# Patient Record
Sex: Male | Born: 1980 | Race: White | Hispanic: No | Marital: Married | Smoking: Never smoker
Health system: Southern US, Community
[De-identification: ages and names within clinical notes are randomized; demographics above are authoritative.]

## PROBLEM LIST (undated history)

## (undated) DIAGNOSIS — S92919A Unspecified fracture of unspecified toe(s), initial encounter for closed fracture: Secondary | ICD-10-CM

## (undated) DIAGNOSIS — M1990 Unspecified osteoarthritis, unspecified site: Secondary | ICD-10-CM

## (undated) DIAGNOSIS — Z8781 Personal history of (healed) traumatic fracture: Secondary | ICD-10-CM

## (undated) DIAGNOSIS — I82409 Acute embolism and thrombosis of unspecified deep veins of unspecified lower extremity: Secondary | ICD-10-CM

## (undated) HISTORY — PX: SPINE SURGERY: SHX786

## (undated) HISTORY — PX: ABDOMINAL SURGERY: SHX537

## (undated) HISTORY — PX: COSMETIC SURGERY: SHX468

---

## 2016-11-28 DIAGNOSIS — Z0000 Encounter for general adult medical examination without abnormal findings: Secondary | ICD-10-CM | POA: Diagnosis not present

## 2017-01-15 DIAGNOSIS — Z713 Dietary counseling and surveillance: Secondary | ICD-10-CM | POA: Diagnosis not present

## 2017-01-15 DIAGNOSIS — Z6832 Body mass index (BMI) 32.0-32.9, adult: Secondary | ICD-10-CM | POA: Diagnosis not present

## 2017-01-15 DIAGNOSIS — Z136 Encounter for screening for cardiovascular disorders: Secondary | ICD-10-CM | POA: Diagnosis not present

## 2017-04-09 DIAGNOSIS — K2900 Acute gastritis without bleeding: Secondary | ICD-10-CM | POA: Diagnosis not present

## 2018-01-26 DIAGNOSIS — M9903 Segmental and somatic dysfunction of lumbar region: Secondary | ICD-10-CM | POA: Diagnosis not present

## 2018-01-26 DIAGNOSIS — M5442 Lumbago with sciatica, left side: Secondary | ICD-10-CM | POA: Diagnosis not present

## 2018-01-27 DIAGNOSIS — M5442 Lumbago with sciatica, left side: Secondary | ICD-10-CM | POA: Diagnosis not present

## 2018-01-27 DIAGNOSIS — M9903 Segmental and somatic dysfunction of lumbar region: Secondary | ICD-10-CM | POA: Diagnosis not present

## 2018-01-29 DIAGNOSIS — M9903 Segmental and somatic dysfunction of lumbar region: Secondary | ICD-10-CM | POA: Diagnosis not present

## 2018-01-29 DIAGNOSIS — M5442 Lumbago with sciatica, left side: Secondary | ICD-10-CM | POA: Diagnosis not present

## 2018-02-02 DIAGNOSIS — M9903 Segmental and somatic dysfunction of lumbar region: Secondary | ICD-10-CM | POA: Diagnosis not present

## 2018-02-02 DIAGNOSIS — M5442 Lumbago with sciatica, left side: Secondary | ICD-10-CM | POA: Diagnosis not present

## 2018-02-03 DIAGNOSIS — M9903 Segmental and somatic dysfunction of lumbar region: Secondary | ICD-10-CM | POA: Diagnosis not present

## 2018-02-03 DIAGNOSIS — M5442 Lumbago with sciatica, left side: Secondary | ICD-10-CM | POA: Diagnosis not present

## 2018-02-04 DIAGNOSIS — M5442 Lumbago with sciatica, left side: Secondary | ICD-10-CM | POA: Diagnosis not present

## 2018-02-04 DIAGNOSIS — M9903 Segmental and somatic dysfunction of lumbar region: Secondary | ICD-10-CM | POA: Diagnosis not present

## 2018-02-10 DIAGNOSIS — M9903 Segmental and somatic dysfunction of lumbar region: Secondary | ICD-10-CM | POA: Diagnosis not present

## 2018-02-10 DIAGNOSIS — M5442 Lumbago with sciatica, left side: Secondary | ICD-10-CM | POA: Diagnosis not present

## 2018-02-11 DIAGNOSIS — M5442 Lumbago with sciatica, left side: Secondary | ICD-10-CM | POA: Diagnosis not present

## 2018-02-11 DIAGNOSIS — M9903 Segmental and somatic dysfunction of lumbar region: Secondary | ICD-10-CM | POA: Diagnosis not present

## 2018-02-12 DIAGNOSIS — M5442 Lumbago with sciatica, left side: Secondary | ICD-10-CM | POA: Diagnosis not present

## 2018-02-12 DIAGNOSIS — M9903 Segmental and somatic dysfunction of lumbar region: Secondary | ICD-10-CM | POA: Diagnosis not present

## 2018-02-18 DIAGNOSIS — M5442 Lumbago with sciatica, left side: Secondary | ICD-10-CM | POA: Diagnosis not present

## 2018-02-18 DIAGNOSIS — M9903 Segmental and somatic dysfunction of lumbar region: Secondary | ICD-10-CM | POA: Diagnosis not present

## 2018-02-19 DIAGNOSIS — M5442 Lumbago with sciatica, left side: Secondary | ICD-10-CM | POA: Diagnosis not present

## 2018-02-19 DIAGNOSIS — M9903 Segmental and somatic dysfunction of lumbar region: Secondary | ICD-10-CM | POA: Diagnosis not present

## 2018-02-25 DIAGNOSIS — M5442 Lumbago with sciatica, left side: Secondary | ICD-10-CM | POA: Diagnosis not present

## 2018-02-25 DIAGNOSIS — M9903 Segmental and somatic dysfunction of lumbar region: Secondary | ICD-10-CM | POA: Diagnosis not present

## 2018-02-26 DIAGNOSIS — M5442 Lumbago with sciatica, left side: Secondary | ICD-10-CM | POA: Diagnosis not present

## 2018-02-26 DIAGNOSIS — M9903 Segmental and somatic dysfunction of lumbar region: Secondary | ICD-10-CM | POA: Diagnosis not present

## 2018-03-12 DIAGNOSIS — M545 Low back pain: Secondary | ICD-10-CM | POA: Diagnosis not present

## 2018-03-12 DIAGNOSIS — G8929 Other chronic pain: Secondary | ICD-10-CM | POA: Diagnosis not present

## 2018-03-18 DIAGNOSIS — M5127 Other intervertebral disc displacement, lumbosacral region: Secondary | ICD-10-CM | POA: Diagnosis not present

## 2018-03-18 DIAGNOSIS — M545 Low back pain: Secondary | ICD-10-CM | POA: Diagnosis not present

## 2018-03-18 DIAGNOSIS — M48061 Spinal stenosis, lumbar region without neurogenic claudication: Secondary | ICD-10-CM | POA: Diagnosis not present

## 2018-03-24 DIAGNOSIS — R030 Elevated blood-pressure reading, without diagnosis of hypertension: Secondary | ICD-10-CM | POA: Diagnosis not present

## 2018-03-24 DIAGNOSIS — Z6834 Body mass index (BMI) 34.0-34.9, adult: Secondary | ICD-10-CM | POA: Diagnosis not present

## 2018-03-24 DIAGNOSIS — M545 Low back pain: Secondary | ICD-10-CM | POA: Diagnosis not present

## 2018-05-29 DIAGNOSIS — M5126 Other intervertebral disc displacement, lumbar region: Secondary | ICD-10-CM | POA: Diagnosis not present

## 2018-06-02 ENCOUNTER — Other Ambulatory Visit: Payer: Self-pay | Admitting: Neurosurgery

## 2018-06-04 DIAGNOSIS — M5416 Radiculopathy, lumbar region: Secondary | ICD-10-CM | POA: Diagnosis not present

## 2018-06-04 DIAGNOSIS — M48061 Spinal stenosis, lumbar region without neurogenic claudication: Secondary | ICD-10-CM | POA: Diagnosis not present

## 2018-06-05 ENCOUNTER — Other Ambulatory Visit: Payer: Self-pay | Admitting: Neurosurgery

## 2018-06-05 DIAGNOSIS — M5126 Other intervertebral disc displacement, lumbar region: Secondary | ICD-10-CM

## 2018-06-09 DIAGNOSIS — M5416 Radiculopathy, lumbar region: Secondary | ICD-10-CM | POA: Diagnosis not present

## 2018-06-09 DIAGNOSIS — M48061 Spinal stenosis, lumbar region without neurogenic claudication: Secondary | ICD-10-CM | POA: Diagnosis not present

## 2018-06-11 DIAGNOSIS — R030 Elevated blood-pressure reading, without diagnosis of hypertension: Secondary | ICD-10-CM | POA: Diagnosis not present

## 2018-06-11 DIAGNOSIS — M48061 Spinal stenosis, lumbar region without neurogenic claudication: Secondary | ICD-10-CM | POA: Diagnosis not present

## 2018-06-11 DIAGNOSIS — M5126 Other intervertebral disc displacement, lumbar region: Secondary | ICD-10-CM | POA: Diagnosis not present

## 2018-06-11 DIAGNOSIS — M48062 Spinal stenosis, lumbar region with neurogenic claudication: Secondary | ICD-10-CM | POA: Diagnosis not present

## 2018-06-11 DIAGNOSIS — M5416 Radiculopathy, lumbar region: Secondary | ICD-10-CM | POA: Diagnosis not present

## 2018-06-11 DIAGNOSIS — M5440 Lumbago with sciatica, unspecified side: Secondary | ICD-10-CM | POA: Diagnosis not present

## 2018-06-12 ENCOUNTER — Other Ambulatory Visit: Payer: Self-pay | Admitting: Neurosurgery

## 2018-06-16 DIAGNOSIS — M5416 Radiculopathy, lumbar region: Secondary | ICD-10-CM | POA: Diagnosis not present

## 2018-06-16 DIAGNOSIS — M48061 Spinal stenosis, lumbar region without neurogenic claudication: Secondary | ICD-10-CM | POA: Diagnosis not present

## 2018-06-18 ENCOUNTER — Ambulatory Visit
Admission: RE | Admit: 2018-06-18 | Discharge: 2018-06-18 | Disposition: A | Discharge status: Home / Self Care | Payer: BLUE CROSS/BLUE SHIELD | Source: Ambulatory Visit | Attending: Neurosurgery | Admitting: Neurosurgery

## 2018-06-18 DIAGNOSIS — M5126 Other intervertebral disc displacement, lumbar region: Secondary | ICD-10-CM

## 2018-06-18 DIAGNOSIS — M5416 Radiculopathy, lumbar region: Secondary | ICD-10-CM | POA: Diagnosis not present

## 2018-06-18 DIAGNOSIS — M48061 Spinal stenosis, lumbar region without neurogenic claudication: Secondary | ICD-10-CM | POA: Diagnosis not present

## 2018-06-18 MED ORDER — IOPAMIDOL (ISOVUE-M 200) INJECTION 41%
1.0000 mL | Freq: Once | INTRAMUSCULAR | Status: AC
  Administered 2018-06-18: 1 mL via EPIDURAL

## 2018-06-18 MED ORDER — METHYLPREDNISOLONE ACETATE 40 MG/ML INJ SUSP (RADIOLOG
120.0000 mg | Freq: Once | INTRAMUSCULAR | Status: AC
  Administered 2018-06-18: 120 mg via EPIDURAL

## 2018-06-18 NOTE — Discharge Instructions (Signed)

## 2018-06-25 DIAGNOSIS — M48061 Spinal stenosis, lumbar region without neurogenic claudication: Secondary | ICD-10-CM | POA: Diagnosis not present

## 2018-06-25 DIAGNOSIS — M5416 Radiculopathy, lumbar region: Secondary | ICD-10-CM | POA: Diagnosis not present

## 2018-06-25 NOTE — Pre-Procedure Instructions (Signed)
Rockland Kotarski Quarry  06/25/2018      [id number]y Kindle [address] [place] [address] Phone: [phone] Fax: [phone]    Your procedure is scheduled on Friday October 18.  Report to [facility] Admitting at 10:00 A.M.  Call this number if you have problems the morning of surgery:  [phone]   Remember:  Do not eat or drink after midnight.    Take these medicines the morning of surgery with A SIP OF WATER: NONE  7 days prior to surgery STOP taking any Aspirin(unless otherwise instructed by your surgeon), Aleve, Naproxen, Ibuprofen, Motrin, Advil, Goody's, BC's, all herbal medications, fish oil, and all vitamins     Do not wear jewelry, make-up or nail polish.  Do not wear lotions, powders, or perfumes, or deodorant.  Do not shave 48 hours prior to surgery.  Men may shave face and neck.  Do not bring valuables to the hospital.  [facility] is not responsible for any belongings or valuables.  Contacts, dentures or bridgework may not be worn into surgery.  Leave your suitcase in the car.  After surgery it may be brought to your room.  For patients admitted to the hospital, discharge time will be determined by your treatment team.  Patients discharged the day of surgery will not be allowed to drive home.   Special instructions:    [facility]- Preparing For Surgery  Before surgery, you can play an important role. Because skin is not sterile, your skin needs to be as free of germs as possible. You can reduce the number of germs on your skin by washing with CHG (chlorahexidine gluconate) Soap before surgery.  CHG is an antiseptic cleaner which kills germs and bonds with the skin to continue killing germs even after washing.    Oral Hygiene is also important to reduce your risk of infection.  Remember - BRUSH YOUR TEETH THE MORNING OF SURGERY WITH YOUR REGULAR TOOTHPASTE  Please do not use if you have an allergy to CHG  or antibacterial soaps. If your skin becomes reddened/irritated stop using the CHG.  Do not shave (including legs and underarms) for at least 48 hours prior to first CHG shower. It is OK to shave your face.  Please follow these instructions carefully.   1. Shower the NIGHT BEFORE SURGERY and the MORNING OF SURGERY with CHG.   2. If you chose to wash your hair, wash your hair first as usual with your normal shampoo.  3. After you shampoo, rinse your hair and body thoroughly to remove the shampoo.  4. Use CHG as you would any other liquid soap. You can apply CHG directly to the skin and wash gently with a scrungie or a clean washcloth.   5. Apply the CHG Soap to your body ONLY FROM THE NECK DOWN.  Do not use on open wounds or open sores. Avoid contact with your eyes, ears, mouth and genitals (private parts). Wash Face and genitals (private parts)  with your normal soap.  6. Wash thoroughly, paying special attention to the area where your surgery will be performed.  7. Thoroughly rinse your body with warm water from the neck down.  8. DO NOT shower/wash with your normal soap after using and rinsing off the CHG Soap.  9. Pat yourself dry with a CLEAN TOWEL.  10. Wear CLEAN PAJAMAS to bed the night before surgery, wear comfortable clothes the morning of surgery  11. Place CLEAN SHEETS on your bed the night of your first shower and DO NOT SLEEP WITH PETS.    Day of Surgery:  Do not apply any deodorants/lotions.  Please wear clean clothes to the hospital/surgery center.   Remember to brush your teeth WITH YOUR REGULAR TOOTHPASTE.    Please read over the following fact sheets that you were given. Coughing and Deep Breathing, MRSA Information and Surgical Site Infection Prevention

## 2018-06-26 ENCOUNTER — Encounter (HOSPITAL_COMMUNITY)
Admission: RE | Admit: 2018-06-26 | Discharge: 2018-06-26 | Disposition: A | Discharge status: Home / Self Care | Payer: BLUE CROSS/BLUE SHIELD | Source: Ambulatory Visit | Attending: Neurosurgery | Admitting: Neurosurgery

## 2018-06-26 ENCOUNTER — Encounter (HOSPITAL_COMMUNITY): Payer: Self-pay

## 2018-06-26 ENCOUNTER — Other Ambulatory Visit: Payer: Self-pay

## 2018-06-26 DIAGNOSIS — M48061 Spinal stenosis, lumbar region without neurogenic claudication: Secondary | ICD-10-CM | POA: Diagnosis not present

## 2018-06-26 DIAGNOSIS — Z01818 Encounter for other preprocedural examination: Secondary | ICD-10-CM | POA: Insufficient documentation

## 2018-06-26 DIAGNOSIS — M5416 Radiculopathy, lumbar region: Secondary | ICD-10-CM | POA: Diagnosis not present

## 2018-06-26 HISTORY — DX: Unspecified fracture of unspecified toe(s), initial encounter for closed fracture: S92.919A

## 2018-06-26 HISTORY — DX: Personal history of (healed) traumatic fracture: Z87.81

## 2018-06-26 HISTORY — DX: Unspecified osteoarthritis, unspecified site: M19.90

## 2018-06-26 LAB — TYPE AND SCREEN
ABO/RH(D): A NEG
Antibody Screen: NEGATIVE

## 2018-06-26 LAB — CBC
HCT: 45.8 % (ref 39.0–52.0)
Hemoglobin: 15.1 g/dL (ref 13.0–17.0)
MCH: 28.4 pg (ref 26.0–34.0)
MCHC: 33 g/dL (ref 30.0–36.0)
MCV: 86.3 fL (ref 80.0–100.0)
NRBC: 0 % (ref 0.0–0.2)
PLATELETS: 226 10*3/uL (ref 150–400)
RBC: 5.31 MIL/uL (ref 4.22–5.81)
RDW: 12.8 % (ref 11.5–15.5)
WBC: 7.1 10*3/uL (ref 4.0–10.5)

## 2018-06-26 LAB — SURGICAL PCR SCREEN
MRSA, PCR: NEGATIVE
Staphylococcus aureus: NEGATIVE

## 2018-06-26 LAB — ABO/RH: ABO/RH(D): A NEG

## 2018-06-26 NOTE — Progress Notes (Signed)
PCP - Dr. Darrow Bussing Cardiologist - denies, and denies any cardiac studies.  Chest x-ray - N/A EKG - N/A  Aspirin Instructions: N/A  Anesthesia review: No  Patient denies shortness of breath, fever, cough and chest pain at PAT appointment   Patient verbalized understanding of instructions that were given to them at the PAT appointment. Patient was also instructed that they will need to review over the PAT instructions again at home before surgery.

## 2018-07-02 NOTE — Anesthesia Preprocedure Evaluation (Addendum)
Anesthesia Evaluation  Patient identified by MRN, date of birth, ID band Patient awake    Reviewed: Allergy & Precautions, H&P , NPO status , Patient's Chart, lab work & pertinent test results  Airway Mallampati: II  TM Distance: >3 FB Neck ROM: Full    Dental no notable dental hx. (+) Teeth Intact, Dental Advisory Given   Pulmonary neg pulmonary ROS,    Pulmonary exam normal breath sounds clear to auscultation       Cardiovascular Exercise Tolerance: Good negative cardio ROS Normal cardiovascular exam Rhythm:Regular Rate:Normal     Neuro/Psych negative neurological ROS  negative psych ROS   GI/Hepatic negative GI ROS, Neg liver ROS,   Endo/Other  negative endocrine ROS  Renal/GU negative Renal ROS     Musculoskeletal   Abdominal   Peds negative pediatric ROS (+)  Hematology negative hematology ROS (+)   Anesthesia Other Findings   Reproductive/Obstetrics                            Anesthesia Physical Anesthesia Plan  ASA: II  Anesthesia Plan: General   Post-op Pain Management:    Induction: Intravenous  PONV Risk Score and Plan: 2 and Treatment may vary due to age or medical condition, Ondansetron and Dexamethasone  Airway Management Planned: Oral ETT  Additional Equipment: Arterial line  Intra-op Plan:   Post-operative Plan: Extubation in OR  Informed Consent: I have reviewed the patients History and Physical, chart, labs and discussed the procedure including the risks, benefits and alternatives for the proposed anesthesia with the patient or authorized representative who has indicated his/her understanding and acceptance.   Dental advisory given  Plan Discussed with: CRNA  Anesthesia Plan Comments:        Anesthesia Quick Evaluation

## 2018-07-03 ENCOUNTER — Inpatient Hospital Stay (HOSPITAL_COMMUNITY): Payer: BLUE CROSS/BLUE SHIELD | Admitting: Anesthesiology

## 2018-07-03 ENCOUNTER — Inpatient Hospital Stay (HOSPITAL_COMMUNITY)
Admission: RE | Admit: 2018-07-03 | Discharge: 2018-07-04 | DRG: 460 | Disposition: A | Discharge status: Home / Self Care | Payer: BLUE CROSS/BLUE SHIELD | Source: Ambulatory Visit | Attending: Neurosurgery | Admitting: Neurosurgery

## 2018-07-03 ENCOUNTER — Inpatient Hospital Stay (HOSPITAL_COMMUNITY)
Admission: RE | Disposition: A | Discharge status: Home / Self Care | Payer: Self-pay | Source: Ambulatory Visit | Attending: Neurosurgery

## 2018-07-03 ENCOUNTER — Encounter (HOSPITAL_COMMUNITY): Payer: Self-pay | Admitting: General Practice

## 2018-07-03 ENCOUNTER — Inpatient Hospital Stay (HOSPITAL_COMMUNITY): Payer: BLUE CROSS/BLUE SHIELD

## 2018-07-03 ENCOUNTER — Other Ambulatory Visit: Payer: Self-pay

## 2018-07-03 DIAGNOSIS — R402363 Coma scale, best motor response, obeys commands, at hospital admission: Secondary | ICD-10-CM | POA: Diagnosis present

## 2018-07-03 DIAGNOSIS — R402143 Coma scale, eyes open, spontaneous, at hospital admission: Secondary | ICD-10-CM | POA: Diagnosis not present

## 2018-07-03 DIAGNOSIS — M48061 Spinal stenosis, lumbar region without neurogenic claudication: Principal | ICD-10-CM | POA: Diagnosis present

## 2018-07-03 DIAGNOSIS — R402253 Coma scale, best verbal response, oriented, at hospital admission: Secondary | ICD-10-CM | POA: Diagnosis present

## 2018-07-03 DIAGNOSIS — M479 Spondylosis, unspecified: Secondary | ICD-10-CM | POA: Diagnosis not present

## 2018-07-03 DIAGNOSIS — M5126 Other intervertebral disc displacement, lumbar region: Secondary | ICD-10-CM | POA: Diagnosis not present

## 2018-07-03 DIAGNOSIS — Z419 Encounter for procedure for purposes other than remedying health state, unspecified: Secondary | ICD-10-CM

## 2018-07-03 DIAGNOSIS — M4690 Unspecified inflammatory spondylopathy, site unspecified: Secondary | ICD-10-CM | POA: Diagnosis not present

## 2018-07-03 DIAGNOSIS — Z981 Arthrodesis status: Secondary | ICD-10-CM | POA: Diagnosis not present

## 2018-07-03 DIAGNOSIS — M5116 Intervertebral disc disorders with radiculopathy, lumbar region: Secondary | ICD-10-CM | POA: Diagnosis not present

## 2018-07-03 DIAGNOSIS — M48062 Spinal stenosis, lumbar region with neurogenic claudication: Secondary | ICD-10-CM | POA: Diagnosis not present

## 2018-07-03 SURGERY — POSTERIOR LUMBAR FUSION 1 LEVEL
Anesthesia: General | Site: Back

## 2018-07-03 MED ORDER — METHOCARBAMOL 500 MG PO TABS
ORAL_TABLET | ORAL | Status: AC
  Administered 2018-07-03: 500 mg
  Filled 2018-07-03: qty 1

## 2018-07-03 MED ORDER — FENTANYL CITRATE (PF) 250 MCG/5ML IJ SOLN
INTRAMUSCULAR | Status: DC | PRN
  Administered 2018-07-03 (×2): 50 ug via INTRAVENOUS

## 2018-07-03 MED ORDER — 0.9 % SODIUM CHLORIDE (POUR BTL) OPTIME
TOPICAL | Status: DC | PRN
  Administered 2018-07-03 (×2): 1000 mL

## 2018-07-03 MED ORDER — ACETAMINOPHEN 650 MG RE SUPP: 650.0000 mg | RECTAL | Status: DC | PRN

## 2018-07-03 MED ORDER — PHENYLEPHRINE 40 MCG/ML (10ML) SYRINGE FOR IV PUSH (FOR BLOOD PRESSURE SUPPORT)
PREFILLED_SYRINGE | INTRAVENOUS | Status: AC
  Filled 2018-07-03: qty 30

## 2018-07-03 MED ORDER — ROCURONIUM BROMIDE 50 MG/5ML IV SOSY
PREFILLED_SYRINGE | INTRAVENOUS | Status: AC
  Filled 2018-07-03: qty 10

## 2018-07-03 MED ORDER — ROCURONIUM BROMIDE 10 MG/ML (PF) SYRINGE
PREFILLED_SYRINGE | INTRAVENOUS | Status: DC | PRN
  Administered 2018-07-03: 10 mg via INTRAVENOUS
  Administered 2018-07-03: 20 mg via INTRAVENOUS
  Administered 2018-07-03: 50 mg via INTRAVENOUS
  Administered 2018-07-03: 20 mg via INTRAVENOUS

## 2018-07-03 MED ORDER — CHLORHEXIDINE GLUCONATE CLOTH 2 % EX PADS: 6.0000 | MEDICATED_PAD | Freq: Once | CUTANEOUS | Status: DC

## 2018-07-03 MED ORDER — PROPOFOL 10 MG/ML IV BOLUS
INTRAVENOUS | Status: DC | PRN
  Administered 2018-07-03: 200 mg via INTRAVENOUS

## 2018-07-03 MED ORDER — OXYCODONE HCL 5 MG PO TABS
ORAL_TABLET | ORAL | Status: AC
  Administered 2018-07-03: 10 mg via ORAL
  Filled 2018-07-03: qty 2

## 2018-07-03 MED ORDER — LIDOCAINE 2% (20 MG/ML) 5 ML SYRINGE
INTRAMUSCULAR | Status: DC | PRN
  Administered 2018-07-03: 100 mg via INTRAVENOUS

## 2018-07-03 MED ORDER — GABAPENTIN 300 MG PO CAPS
300.0000 mg | ORAL_CAPSULE | Freq: Once | ORAL | Status: AC
  Administered 2018-07-03: 300 mg via ORAL

## 2018-07-03 MED ORDER — FENTANYL CITRATE (PF) 250 MCG/5ML IJ SOLN
INTRAMUSCULAR | Status: AC
  Filled 2018-07-03: qty 5

## 2018-07-03 MED ORDER — LIDOCAINE-EPINEPHRINE 1 %-1:100000 IJ SOLN
INTRAMUSCULAR | Status: AC
  Filled 2018-07-03: qty 1

## 2018-07-03 MED ORDER — VANCOMYCIN HCL 1000 MG IV SOLR
INTRAVENOUS | Status: AC
  Filled 2018-07-03: qty 1000

## 2018-07-03 MED ORDER — ONDANSETRON HCL 4 MG/2ML IJ SOLN
4.0000 mg | Freq: Four times a day (QID) | INTRAMUSCULAR | Status: DC | PRN
  Administered 2018-07-03: 4 mg via INTRAVENOUS
  Filled 2018-07-03: qty 2

## 2018-07-03 MED ORDER — HYDROMORPHONE HCL 1 MG/ML IJ SOLN
INTRAMUSCULAR | Status: DC | PRN
  Administered 2018-07-03: 0.5 mg via INTRAVENOUS
  Administered 2018-07-03 (×2): .25 mg via INTRAVENOUS

## 2018-07-03 MED ORDER — DEXAMETHASONE 4 MG PO TABS
4.0000 mg | ORAL_TABLET | Freq: Four times a day (QID) | ORAL | Status: DC
  Administered 2018-07-04: 4 mg via ORAL
  Filled 2018-07-03: qty 1

## 2018-07-03 MED ORDER — CEFAZOLIN SODIUM-DEXTROSE 2-4 GM/100ML-% IV SOLN
2.0000 g | INTRAVENOUS | Status: AC
  Administered 2018-07-03: 2 g via INTRAVENOUS

## 2018-07-03 MED ORDER — ACETAMINOPHEN 500 MG PO TABS
1000.0000 mg | ORAL_TABLET | Freq: Once | ORAL | Status: AC
  Administered 2018-07-03: 1000 mg via ORAL

## 2018-07-03 MED ORDER — HYDROMORPHONE HCL 1 MG/ML IJ SOLN
INTRAMUSCULAR | Status: AC
  Filled 2018-07-03: qty 0.5

## 2018-07-03 MED ORDER — HYDROCODONE-ACETAMINOPHEN 7.5-325 MG PO TABS: 1.0000 | ORAL_TABLET | Freq: Once | ORAL | Status: DC | PRN

## 2018-07-03 MED ORDER — OXYCODONE HCL 5 MG PO TABS
10.0000 mg | ORAL_TABLET | ORAL | Status: DC | PRN
  Administered 2018-07-03 – 2018-07-04 (×5): 10 mg via ORAL
  Filled 2018-07-03 (×4): qty 2

## 2018-07-03 MED ORDER — THROMBIN 20000 UNITS EX SOLR
CUTANEOUS | Status: DC | PRN
  Administered 2018-07-03: 12:00:00 via TOPICAL

## 2018-07-03 MED ORDER — DEXAMETHASONE SODIUM PHOSPHATE 4 MG/ML IJ SOLN
4.0000 mg | Freq: Four times a day (QID) | INTRAMUSCULAR | Status: DC
  Administered 2018-07-03: 4 mg via INTRAVENOUS
  Filled 2018-07-03: qty 1

## 2018-07-03 MED ORDER — SUGAMMADEX SODIUM 200 MG/2ML IV SOLN
INTRAVENOUS | Status: DC | PRN
  Administered 2018-07-03: 200 mg via INTRAVENOUS

## 2018-07-03 MED ORDER — PROMETHAZINE HCL 25 MG/ML IJ SOLN: 6.2500 mg | INTRAMUSCULAR | Status: DC | PRN

## 2018-07-03 MED ORDER — LIDOCAINE 2% (20 MG/ML) 5 ML SYRINGE
INTRAMUSCULAR | Status: AC
  Filled 2018-07-03: qty 5

## 2018-07-03 MED ORDER — ACETAMINOPHEN 10 MG/ML IV SOLN: 1000.0000 mg | Freq: Once | INTRAVENOUS | Status: DC | PRN

## 2018-07-03 MED ORDER — EPHEDRINE 5 MG/ML INJ
INTRAVENOUS | Status: AC
  Filled 2018-07-03: qty 20

## 2018-07-03 MED ORDER — PANTOPRAZOLE SODIUM 40 MG PO TBEC: 40.0000 mg | DELAYED_RELEASE_TABLET | Freq: Every day | ORAL | Status: DC

## 2018-07-03 MED ORDER — MIDAZOLAM HCL 5 MG/5ML IJ SOLN
INTRAMUSCULAR | Status: DC | PRN
  Administered 2018-07-03: 2 mg via INTRAVENOUS

## 2018-07-03 MED ORDER — BUPIVACAINE-EPINEPHRINE (PF) 0.25% -1:200000 IJ SOLN
INTRAMUSCULAR | Status: AC
  Filled 2018-07-03: qty 30

## 2018-07-03 MED ORDER — MIDAZOLAM HCL 2 MG/2ML IJ SOLN
INTRAMUSCULAR | Status: AC
  Filled 2018-07-03: qty 2

## 2018-07-03 MED ORDER — HYDROMORPHONE HCL 1 MG/ML IJ SOLN
0.2500 mg | INTRAMUSCULAR | Status: DC | PRN
  Administered 2018-07-03: 1 mg via INTRAVENOUS

## 2018-07-03 MED ORDER — CLONIDINE HCL 0.2 MG PO TABS
0.2000 mg | ORAL_TABLET | Freq: Once | ORAL | Status: AC
  Administered 2018-07-03: 0.2 mg via ORAL

## 2018-07-03 MED ORDER — GLYCOPYRROLATE PF 0.2 MG/ML IJ SOSY
PREFILLED_SYRINGE | INTRAMUSCULAR | Status: AC
  Filled 2018-07-03: qty 2

## 2018-07-03 MED ORDER — HYDROMORPHONE HCL 1 MG/ML IJ SOLN: 1.0000 mg | INTRAMUSCULAR | Status: DC | PRN

## 2018-07-03 MED ORDER — GABAPENTIN 300 MG PO CAPS
ORAL_CAPSULE | ORAL | Status: AC
  Administered 2018-07-03: 300 mg via ORAL
  Filled 2018-07-03: qty 1

## 2018-07-03 MED ORDER — ONDANSETRON HCL 4 MG PO TABS: 4.0000 mg | ORAL_TABLET | Freq: Four times a day (QID) | ORAL | Status: DC | PRN

## 2018-07-03 MED ORDER — SODIUM CHLORIDE 0.9% FLUSH: 3.0000 mL | Freq: Two times a day (BID) | INTRAVENOUS | Status: DC

## 2018-07-03 MED ORDER — HYDROXYZINE HCL 50 MG/ML IM SOLN
50.0000 mg | Freq: Four times a day (QID) | INTRAMUSCULAR | Status: DC | PRN
  Administered 2018-07-03: 50 mg via INTRAMUSCULAR
  Filled 2018-07-03: qty 1

## 2018-07-03 MED ORDER — ACETAMINOPHEN 325 MG PO TABS
650.0000 mg | ORAL_TABLET | ORAL | Status: DC | PRN
  Administered 2018-07-03: 650 mg via ORAL
  Filled 2018-07-03: qty 2

## 2018-07-03 MED ORDER — ALUM & MAG HYDROXIDE-SIMETH 200-200-20 MG/5ML PO SUSP: 30.0000 mL | Freq: Four times a day (QID) | ORAL | Status: DC | PRN

## 2018-07-03 MED ORDER — HYDROMORPHONE HCL 1 MG/ML IJ SOLN
INTRAMUSCULAR | Status: AC
  Filled 2018-07-03: qty 1

## 2018-07-03 MED ORDER — PROPOFOL 10 MG/ML IV BOLUS
INTRAVENOUS | Status: AC
  Filled 2018-07-03: qty 20

## 2018-07-03 MED ORDER — CEFAZOLIN SODIUM-DEXTROSE 2-4 GM/100ML-% IV SOLN
2.0000 g | Freq: Three times a day (TID) | INTRAVENOUS | Status: AC
  Administered 2018-07-03 – 2018-07-04 (×2): 2 g via INTRAVENOUS
  Filled 2018-07-03 (×2): qty 100

## 2018-07-03 MED ORDER — MENTHOL 3 MG MT LOZG: 1.0000 | LOZENGE | OROMUCOSAL | Status: DC | PRN

## 2018-07-03 MED ORDER — VANCOMYCIN HCL 1 G IV SOLR
INTRAVENOUS | Status: DC | PRN
  Administered 2018-07-03: 1000 mg via TOPICAL

## 2018-07-03 MED ORDER — ONDANSETRON HCL 4 MG/2ML IJ SOLN
INTRAMUSCULAR | Status: AC
  Filled 2018-07-03: qty 2

## 2018-07-03 MED ORDER — THROMBIN (RECOMBINANT) 20000 UNITS EX SOLR
CUTANEOUS | Status: AC
  Filled 2018-07-03: qty 20000

## 2018-07-03 MED ORDER — SUCCINYLCHOLINE CHLORIDE 200 MG/10ML IV SOSY
PREFILLED_SYRINGE | INTRAVENOUS | Status: AC
  Filled 2018-07-03: qty 10

## 2018-07-03 MED ORDER — CYCLOBENZAPRINE HCL 10 MG PO TABS
10.0000 mg | ORAL_TABLET | Freq: Three times a day (TID) | ORAL | Status: DC | PRN
  Administered 2018-07-03 – 2018-07-04 (×2): 10 mg via ORAL
  Filled 2018-07-03 (×2): qty 1

## 2018-07-03 MED ORDER — CEFAZOLIN SODIUM-DEXTROSE 2-4 GM/100ML-% IV SOLN
INTRAVENOUS | Status: AC
  Filled 2018-07-03: qty 100

## 2018-07-03 MED ORDER — LACTATED RINGERS IV SOLN
INTRAVENOUS | Status: DC
  Administered 2018-07-03 (×2): via INTRAVENOUS

## 2018-07-03 MED ORDER — SODIUM CHLORIDE 0.9 % IV SOLN
INTRAVENOUS | Status: DC | PRN
  Administered 2018-07-03: 12:00:00

## 2018-07-03 MED ORDER — CLONIDINE HCL 0.2 MG PO TABS
ORAL_TABLET | ORAL | Status: AC
  Administered 2018-07-03: 0.2 mg via ORAL
  Filled 2018-07-03: qty 1

## 2018-07-03 MED ORDER — BUPIVACAINE HCL (PF) 0.25 % IJ SOLN
INTRAMUSCULAR | Status: AC
  Filled 2018-07-03: qty 30

## 2018-07-03 MED ORDER — MEPERIDINE HCL 50 MG/ML IJ SOLN: 6.2500 mg | INTRAMUSCULAR | Status: DC | PRN

## 2018-07-03 MED ORDER — ACETAMINOPHEN 500 MG PO TABS
ORAL_TABLET | ORAL | Status: AC
  Administered 2018-07-03: 1000 mg via ORAL
  Filled 2018-07-03: qty 2

## 2018-07-03 MED ORDER — BUPIVACAINE LIPOSOME 1.3 % IJ SUSP
20.0000 mL | INTRAMUSCULAR | Status: AC
  Administered 2018-07-03: 20 mL
  Filled 2018-07-03: qty 20

## 2018-07-03 MED ORDER — DEXAMETHASONE SODIUM PHOSPHATE 10 MG/ML IJ SOLN
INTRAMUSCULAR | Status: DC | PRN
  Administered 2018-07-03: 10 mg via INTRAVENOUS

## 2018-07-03 MED ORDER — LIDOCAINE-EPINEPHRINE 1 %-1:100000 IJ SOLN
INTRAMUSCULAR | Status: DC | PRN
  Administered 2018-07-03: 10 mL

## 2018-07-03 MED ORDER — SODIUM CHLORIDE 0.9% FLUSH: 3.0000 mL | INTRAVENOUS | Status: DC | PRN

## 2018-07-03 MED ORDER — SODIUM CHLORIDE 0.9 % IV SOLN: 250.0000 mL | INTRAVENOUS | Status: DC

## 2018-07-03 MED ORDER — PHENOL 1.4 % MT LIQD: 1.0000 | OROMUCOSAL | Status: DC | PRN

## 2018-07-03 MED ORDER — ONDANSETRON HCL 4 MG/2ML IJ SOLN
INTRAMUSCULAR | Status: DC | PRN
  Administered 2018-07-03: 4 mg via INTRAVENOUS

## 2018-07-03 SURGICAL SUPPLY — 78 items
BAG DECANTER FOR FLEXI CONT (MISCELLANEOUS) ×2 IMPLANT
BASKET BONE COLLECTION (BASKET) ×2 IMPLANT
BENZOIN TINCTURE PRP APPL 2/3 (GAUZE/BANDAGES/DRESSINGS) ×2 IMPLANT
BIT DRILL 5.0/4.0 (BIT) ×1 IMPLANT
BLADE CLIPPER SURG (BLADE) ×2 IMPLANT
BLADE SURG 11 STRL SS (BLADE) ×2 IMPLANT
BONE VIVIGEN FORMABLE 5.4CC (Bone Implant) ×2 IMPLANT
BUR CUTTER 7.0 ROUND (BURR) ×2 IMPLANT
BUR MATCHSTICK NEURO 3.0 LAGG (BURR) ×2 IMPLANT
CAGE RISE 11-17-15 10X26 (Cage) ×4 IMPLANT
CANISTER SUCT 3000ML PPV (MISCELLANEOUS) ×2 IMPLANT
CAP LOCKING (Cap) ×4 IMPLANT
CAP LOCKING 5.5 CREO (Cap) ×4 IMPLANT
CARTRIDGE OIL MAESTRO DRILL (MISCELLANEOUS) ×1 IMPLANT
CONT SPEC 4OZ CLIKSEAL STRL BL (MISCELLANEOUS) ×6 IMPLANT
COVER BACK TABLE 60X90IN (DRAPES) ×2 IMPLANT
COVER WAND RF STERILE (DRAPES) ×2 IMPLANT
DECANTER SPIKE VIAL GLASS SM (MISCELLANEOUS) ×4 IMPLANT
DERMABOND ADVANCED (GAUZE/BANDAGES/DRESSINGS) ×1
DERMABOND ADVANCED .7 DNX12 (GAUZE/BANDAGES/DRESSINGS) ×1 IMPLANT
DIFFUSER DRILL AIR PNEUMATIC (MISCELLANEOUS) ×2 IMPLANT
DRAPE C-ARM 42X72 X-RAY (DRAPES) ×4 IMPLANT
DRAPE C-ARMOR (DRAPES) IMPLANT
DRAPE HALF SHEET 40X57 (DRAPES) IMPLANT
DRAPE LAPAROTOMY 100X72X124 (DRAPES) ×2 IMPLANT
DRAPE SURG 17X23 STRL (DRAPES) ×2 IMPLANT
DRILL 5.0/4.0 (BIT) ×2
DRSG OPSITE 4X5.5 SM (GAUZE/BANDAGES/DRESSINGS) IMPLANT
DRSG OPSITE POSTOP 4X6 (GAUZE/BANDAGES/DRESSINGS) ×4 IMPLANT
DURAPREP 26ML APPLICATOR (WOUND CARE) ×2 IMPLANT
ELECT BLADE 4.0 EZ CLEAN MEGAD (MISCELLANEOUS) ×2
ELECT REM PT RETURN 9FT ADLT (ELECTROSURGICAL) ×2
ELECTRODE BLDE 4.0 EZ CLN MEGD (MISCELLANEOUS) ×1 IMPLANT
ELECTRODE REM PT RTRN 9FT ADLT (ELECTROSURGICAL) ×1 IMPLANT
EVACUATOR 1/8 PVC DRAIN (DRAIN) ×2 IMPLANT
EVACUATOR 3/16  PVC DRAIN (DRAIN)
EVACUATOR 3/16 PVC DRAIN (DRAIN) IMPLANT
GAUZE 4X4 16PLY RFD (DISPOSABLE) IMPLANT
GAUZE SPONGE 4X4 12PLY STRL (GAUZE/BANDAGES/DRESSINGS) ×4 IMPLANT
GLOVE BIO SURGEON STRL SZ7 (GLOVE) IMPLANT
GLOVE BIO SURGEON STRL SZ8 (GLOVE) ×4 IMPLANT
GLOVE BIOGEL PI IND STRL 7.0 (GLOVE) IMPLANT
GLOVE BIOGEL PI INDICATOR 7.0 (GLOVE)
GLOVE ECLIPSE 7.5 STRL STRAW (GLOVE) ×2 IMPLANT
GLOVE EXAM NITRILE LRG STRL (GLOVE) IMPLANT
GLOVE EXAM NITRILE XL STR (GLOVE) IMPLANT
GLOVE EXAM NITRILE XS STR PU (GLOVE) IMPLANT
GLOVE INDICATOR 8.5 STRL (GLOVE) ×4 IMPLANT
GOWN STRL REUS W/ TWL LRG LVL3 (GOWN DISPOSABLE) ×1 IMPLANT
GOWN STRL REUS W/ TWL XL LVL3 (GOWN DISPOSABLE) ×3 IMPLANT
GOWN STRL REUS W/TWL 2XL LVL3 (GOWN DISPOSABLE) IMPLANT
GOWN STRL REUS W/TWL LRG LVL3 (GOWN DISPOSABLE) ×1
GOWN STRL REUS W/TWL XL LVL3 (GOWN DISPOSABLE) ×3
HEMOSTAT POWDER KIT SURGIFOAM (HEMOSTASIS) IMPLANT
KIT BASIN OR (CUSTOM PROCEDURE TRAY) ×2 IMPLANT
KIT TURNOVER KIT B (KITS) ×2 IMPLANT
MILL MEDIUM DISP (BLADE) ×2 IMPLANT
NEEDLE HYPO 21X1.5 SAFETY (NEEDLE) ×2 IMPLANT
NEEDLE HYPO 25X1 1.5 SAFETY (NEEDLE) ×2 IMPLANT
NS IRRIG 1000ML POUR BTL (IV SOLUTION) ×2 IMPLANT
OIL CARTRIDGE MAESTRO DRILL (MISCELLANEOUS) ×2
PACK LAMINECTOMY NEURO (CUSTOM PROCEDURE TRAY) ×2 IMPLANT
PAD ARMBOARD 7.5X6 YLW CONV (MISCELLANEOUS) ×6 IMPLANT
ROD 40MM SPINAL (Rod) ×4 IMPLANT
SHAFT CREO 30MM (Neuro Prosthesis/Implant) ×8 IMPLANT
SPONGE LAP 4X18 RFD (DISPOSABLE) IMPLANT
SPONGE SURGIFOAM ABS GEL 100 (HEMOSTASIS) ×2 IMPLANT
STRIP CLOSURE SKIN 1/2X4 (GAUZE/BANDAGES/DRESSINGS) ×2 IMPLANT
SUT VIC AB 0 CT1 18XCR BRD8 (SUTURE) ×1 IMPLANT
SUT VIC AB 0 CT1 8-18 (SUTURE) ×1
SUT VIC AB 2-0 CT1 18 (SUTURE) ×2 IMPLANT
SUT VIC AB 4-0 PS2 27 (SUTURE) ×2 IMPLANT
SYRINGE 20CC LL (MISCELLANEOUS) ×2 IMPLANT
TOWEL GREEN STERILE (TOWEL DISPOSABLE) ×2 IMPLANT
TOWEL GREEN STERILE FF (TOWEL DISPOSABLE) ×2 IMPLANT
TRAY FOLEY MTR SLVR 16FR STAT (SET/KITS/TRAYS/PACK) ×2 IMPLANT
TULIP CREP AMP 5.5MM (Orthopedic Implant) ×8 IMPLANT
WATER STERILE IRR 1000ML POUR (IV SOLUTION) ×2 IMPLANT

## 2018-07-03 NOTE — Anesthesia Procedure Notes (Signed)
Procedure Name: Intubation Date/Time: 07/03/2018 1:26 PM Performed by: Wilburn Cornelia, CRNA Pre-anesthesia Checklist: Patient identified, Emergency Drugs available, Suction available, Patient being monitored and Timeout performed Patient Re-evaluated:Patient Re-evaluated prior to induction Oxygen Delivery Method: Circle system utilized Preoxygenation: Pre-oxygenation with 100% oxygen Induction Type: IV induction Ventilation: Mask ventilation without difficulty Laryngoscope Size: Mac and 4 Grade View: Grade III Tube type: Oral Tube size: 7.5 mm Number of attempts: 1 Airway Equipment and Method: Stylet Placement Confirmation: ETT inserted through vocal cords under direct vision,  positive ETCO2,  CO2 detector and breath sounds checked- equal and bilateral Secured at: 23 cm Tube secured with: Tape Dental Injury: Teeth and Oropharynx as per pre-operative assessment

## 2018-07-03 NOTE — Op Note (Signed)
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  Posterior lumbar interbody fusion utilizing globus expandable cage system packed with locally harvested autograft mixed with vivigen  #3  Cortical screw fixation L3-4 utilizing the globus modular cortical screw set  Surgeon: Donalee Citrin  Assistant: Ervin Knack  Anesthesia: General  EBL: Minimal  HPI: 37 year old gentleman is a long-standing back bilateral leg pain rating down L3 and L4 nerve root pattern.  Work-up revealed large central herniated disc with severe start spinal stenosis at L3-4.  Due to patient's failed conservative treatment imaging findings and progression of clinical syndrome I recommended decompression stabilization procedure at L3-4.  I extensively reviewed the risks and benefits of the operation with him as well as perioperative course expectations of outcome and alternatives to surgery and he understood and agreed to proceed forward.  Operative procedure: Patient brought in the ER was induced under general anesthesia positioned prone Wilson frame his back was prepped and draped in routine sterile fashion.  Preoperative x-ray localized the appropriate level so after infiltration of 10 cc lidocaine with epi midline incision was made and Bovie elect cautery was used to take down the subcutaneous tissue and subperiosteal dissection was carried lamina of L3 and L4 bilaterally.  Intraoperative x-ray confirmed medication proper levels of bilateral laminotomies were performed initially with a high-speed drill The Bone Shavings and Mucus Trap.  Complete Facetectomies Were Performed Radical Foraminotomies the L3 and L4 Nerve Roots Bilateral.  Epidural Veins Were Coagulated There Was a  Very Large Central Disc Herniation Causing Severe Thecal Sac compression.  The disc space was incised on both sides this space was cleaned out and then we distractors in place endplate preparation was carried out removing a very large central disc fragments.  This decompressed the thecal sac and both L3 and L4 nerve roots.  I selected a 10-17 expandable cage packed with locally harvested autograft mixed and inserted on the patient's left side remove the distractor working on the right prepared the endplates packed local autograft centrally and inserted the contralateral cage.  After all the cage ministered and they were expanded appropriately fluoroscopy confirmed good position.  Attention taken a cortical screw placement under fluoroscopy pilot holes were drilled tapped probed and 6 oh/5 oh by 30 mm cortical screws were placed at L4 and L5 bilaterally then the postop AP and lateral fluoroscopy confirmed good position of the screws I packed some more bone laterally to the cages expect the foramina to confirm patency assembled the heads advanced the screw some more attached 40 mm rods and tightened everything in place.  I then speckle vancomycin powder in the wound injected Exparel in the fascia placed a medium Hemovac drain and closed wound in layers with interrupted Vicryl running 4 subcuticular in the skin Dermabond benzoin Steri-Strips and a sterile dressing was applied patient recovery room in stable condition.  At the end the case and account sponge counts were correct.

## 2018-07-03 NOTE — Transfer of Care (Signed)
Immediate Anesthesia Transfer of Care Note  Patient: Jeffrey Myers  Procedure(s) Performed: Lumbar Three-Four Posterior lumbar interbody fusion (N/A Back)  Patient Location: PACU  Anesthesia Type:General  Level of Consciousness: awake, alert  and oriented  Airway & Oxygen Therapy: Patient Spontanous Breathing and Patient connected to nasal cannula oxygen  Post-op Assessment: Report given to RN and Post -op Vital signs reviewed and stable  Post vital signs: Reviewed and stable  Last Vitals:  Vitals Value Taken Time  BP 126/89 07/03/2018  4:14 PM  Temp    Pulse 89 07/03/2018  4:18 PM  Resp 14 07/03/2018  4:16 PM  SpO2 96 % 07/03/2018  4:18 PM  Vitals shown include unvalidated device data.  Last Pain:  Vitals:   07/03/18 1026  TempSrc:   PainSc: 3       Patients Stated Pain Goal: 2 (07/03/18 1026)  Complications: No apparent anesthesia complications

## 2018-07-03 NOTE — H&P (Signed)
Jeffrey Myers is an 37 y.o. male.   Chief Complaint: Back and left greater than right leg pain HPI: 37 year old with long-standing back bilateral leg pain worse in the left refractory to all forms of conservative physical therapy anti-inflammatory steroid injections.  Work-up revealed severe spondylosis and disc herniation marked facet arthropathy and spinal stenosis at L3-4.  Due to patient progression of clinical syndrome imaging findings and failed conservative treatment I recommended decompressive laminectomy interbody fusion at L3-4.  I have extensively gone over the risks and benefits of the operation with him as well as perioperative course expectations of outcome and alternatives to surgery and he understands and agrees to proceed forward.  Past Medical History:  Diagnosis Date  . Arthritis   . Broken toe    At age 20  . History of broken finger     History reviewed. No pertinent surgical history.  History reviewed. No pertinent family history. Social History:  reports that he has never smoked. He has never used smokeless tobacco. His alcohol and drug histories are not on file.  Allergies: No Known Allergies  No medications prior to admission.    No results found for this or any previous visit (from the past 48 hour(s)). No results found.  Review of Systems  Musculoskeletal: Positive for back pain.  Neurological: Positive for tingling.    Blood pressure (!) 134/93, pulse 60, temperature 97.6 F (36.4 C), temperature source Oral, resp. rate 18, height 5\' 11"  (1.803 m), weight 108.9 kg, SpO2 98 %. Physical Exam  Constitutional: He is oriented to person, place, and time. He appears well-developed and well-nourished.  HENT:  Head: Normocephalic.  Eyes: Pupils are equal, round, and reactive to light.  Neck: Normal range of motion.  Respiratory: Effort normal.  GI: Soft.  Neurological: He is alert and oriented to person, place, and time. He has normal strength. GCS eye  subscore is 4. GCS verbal subscore is 5. GCS motor subscore is 6.  Strength 5 out of 5 iliopsoas, quads, hamstrings, gastroc, into tibialis, EHL.     Assessment/Plan 37 year old presents for an L3-4 posterior lumbar interbody fusion  Thekla Colborn P, MD 07/03/2018, 1:14 PM

## 2018-07-04 MED ORDER — CYCLOBENZAPRINE HCL 10 MG PO TABS: 10.0000 mg | ORAL_TABLET | Freq: Three times a day (TID) | ORAL | 0 refills | Status: DC | PRN

## 2018-07-04 MED ORDER — OXYCODONE HCL 10 MG PO TABS: 10.0000 mg | ORAL_TABLET | ORAL | 0 refills | Status: DC | PRN

## 2018-07-04 NOTE — Discharge Summary (Signed)
  Physician Discharge Summary  Patient ID: DRAYDON CLAIRMONT MRN: [id number] DOB/AGE: 39-Jan-1982 39 y.o. Estimated body mass index is 33.47 kg/m as calculated from the following:   Height as of this encounter: 5\' 11"  (1.803 m).   Weight as of this encounter: 108.9 kg.   Admit date: 07/03/2018 Discharge date: 07/04/2018  Admission Diagnoses: Herniated nucleus pulposus lumbar spinal stenosis L3-4  Discharge Diagnoses: Same Active Problems:   HNP (herniated nucleus pulposus), lumbar   Discharged Condition: good  Hospital Course: Patient was admitted to hospital underwent decompressive laminectomy and fusion L3-4 postoperatively patient did fairly well and covering the floor on the floor was ablating and voiding spontaneously tolerating regular diet and stable for discharge home.  Consults: Significant Diagnostic Studies: Treatments: L3-4 posterior lumbar interbody fusion Discharge Exam: Blood pressure 118/76, pulse 75, temperature 98.3 F (36.8 C), temperature source Oral, resp. rate 18, height 5\' 11"  (1.803 m), weight 108.9 kg, SpO2 94 %. Strength 5 out of 5 wound clean dry and intact  Disposition: Home   Allergies as of 07/04/2018   No Known Allergies     Medication List    TAKE these medications   cyclobenzaprine 10 MG tablet Commonly known as:  FLEXERIL Take 1 tablet (10 mg total) by mouth 3 (three) times daily as needed for muscle spasms.   Oxycodone HCl 10 MG Tabs Take 1 tablet (10 mg total) by mouth every 3 (three) hours as needed for severe pain ((score 7 to 10)).        Signed: Yannis Gumbs P 07/04/2018, 7:57 AM

## 2018-07-04 NOTE — Progress Notes (Signed)
Pt doing well. Pt and wife given D/C instructions with Rx's, verbal understanding was provided. Pt's incision is clean and dry with no sign of infection. Pt's IV and Hemovac were removed prior to D/C. Pt D/C'd home per MD order. Pt is stable @ D/C and has no other needs at this time. Rema Fendt, RN

## 2018-07-04 NOTE — Discharge Instructions (Signed)
Wound Care Keep incision covered and dry for one week.  If you shower prior to then, cover incision with plastic wrap.  You may remove outer bandage after one week and shower.  Do not put any creams, lotions, or ointments on incision. Leave steri-strips on neck.  They will fall off by themselves. Activity Walk each and every day, increasing distance each day. No lifting greater than 5 lbs.  Avoid bending, arching, or twisting. No driving for 2 weeks; may ride as a passenger locally. If provided with back brace, wear when out of bed.  It is not necessary to wear in bed. Diet Resume your normal diet.  Return to Work Will be discussed at you follow up appointment. Call Your Doctor If Any of These Occur Redness, drainage, or swelling at the wound.  Temperature greater than 101 degrees. Severe pain not relieved by pain medication. Incision starts to come apart. Follow Up Appt Call today for appointment in 1-2 weeks ([phone]) or for problems.  If you have any hardware placed in your spine, you will need an x-ray before your appointment.   Spinal Fusion, Care After These instructions give you information about caring for yourself after your procedure. Your doctor may also give you more specific instructions. Call your doctor if you have any problems or questions after your procedure. Follow these instructions at home: Medicines  Take over-the-counter and prescription medicines only as told by your doctor. These include any medicines for pain.  Do not drive for 24 hours if you received a sedative.  Do not drive or use heavy machinery while taking prescription pain medicine.  If you were prescribed an antibiotic medicine, take it as told by your doctor. Do not stop taking the antibiotic even if you start to feel better. Surgical Cut (Incision) Care  Follow instructions from your doctor about how to take care of your surgical cut. Make sure you: ? Wash your hands with soap and water  before you change your bandage (dressing). If you cannot use soap and water, use hand sanitizer. ? Change your bandage as told by your doctor. ? Leave stitches (sutures), skin glue, or skin tape (adhesive) strips in place. They may need to stay in place for 2 weeks or longer. If tape strips get loose and curl up, you may trim the loose edges. Do not remove tape strips completely unless your doctor says it is okay.  Keep your surgical cut clean and dry. Do not take baths, swim, or use a hot tub until your doctor says it is okay.  Check your surgical cut and the area around it every day for: ? Redness. ? Swelling. ? Fluid. Physical Activity  Return to your normal activities as told by your doctor. Ask your doctor what activities are safe for you. Rest and protect your back as much as you can.  Follow instructions from your doctor about how to move. Use good posture to help your spine heal.  Do not lift anything that is heavier than 8 lb (3.6 kg) or as told by your doctor until he or she says that it is safe. Do not lift anything over your head.  Do not twist or bend at the waist until your doctor says it is okay.  Avoid pushing or pulling motions.  Do not sit or lie down in the same position for long periods of time.  Do not start to exercise until your doctor says it is okay. Ask your doctor what kinds of  exercise you can do to make your back stronger. General instructions  If you were given a brace, use it as told by your doctor.  Wear compression stockings as told by your doctor.  Do not use tobacco products. These include cigarettes, chewing tobacco, or e-cigarettes. If you need help quitting, ask your doctor.  Keep all follow-up visits as told by your doctor. This is important. This includes any visits with your physical therapist, if this applies. Contact a doctor if:  Your pain gets worse.  Your medicine does not help your pain.  Your legs or feet become painful or  swollen.  Your surgical cut is red, swollen, or painful.  You have fluid, blood, or pus coming from your surgical cut.  You feel sick to your stomach (nauseous).  You throw up (vomit).  Your have weakness or loss of feeling (numbness) in your legs that is new or getting worse.  You have a fever.  You have trouble controlling when you pee (urinate) or poop (have a bowel movement). Get help right away if:  Your pain is very bad.  You have chest pain.  You have trouble breathing.  You start to have a cough. These symptoms may be an emergency. Do not wait to see if the symptoms will go away. Get medical help right away. Call your local emergency services (911 in the [place] Do not drive yourself to the hospital. This information is not intended to replace advice given to you by your health care provider. Make sure you discuss any questions you have with your health care provider. Document Released: 12/27/2010 Document Revised: 04/30/2016 Document Reviewed: 02/15/2015 Elsevier Interactive Patient Education  [employer].

## 2018-07-06 MED FILL — Thrombin (Recombinant) For Soln 20000 Unit: CUTANEOUS | Qty: 1 | Status: AC

## 2018-07-06 NOTE — Anesthesia Postprocedure Evaluation (Signed)
Anesthesia Post Note  Patient: Jeffrey Myers  Procedure(s) Performed: Lumbar Three-Four Posterior lumbar interbody fusion (N/A Back)     Patient location during evaluation: PACU Anesthesia Type: General Level of consciousness: awake and alert, awake and oriented Pain management: pain level controlled Vital Signs Assessment: post-procedure vital signs reviewed and stable Respiratory status: spontaneous breathing, nonlabored ventilation and respiratory function stable Cardiovascular status: blood pressure returned to baseline and stable Postop Assessment: no apparent nausea or vomiting Anesthetic complications: no    Last Vitals:  Vitals:   07/04/18 0404 07/04/18 0726  BP: 111/69 118/76  Pulse: 77 75  Resp: 20 18  Temp: 36.8 C 36.8 C  SpO2: 95% 94%    Last Pain:  Vitals:   07/04/18 0930  TempSrc:   PainSc: 5                  Cecile Hearing

## 2018-08-08 DIAGNOSIS — M545 Low back pain: Secondary | ICD-10-CM | POA: Diagnosis not present

## 2018-08-11 DIAGNOSIS — M5440 Lumbago with sciatica, unspecified side: Secondary | ICD-10-CM | POA: Diagnosis not present

## 2018-09-07 ENCOUNTER — Other Ambulatory Visit: Payer: Self-pay | Admitting: Neurosurgery

## 2018-09-07 DIAGNOSIS — M5440 Lumbago with sciatica, unspecified side: Secondary | ICD-10-CM

## 2018-09-14 ENCOUNTER — Ambulatory Visit
Admission: RE | Admit: 2018-09-14 | Discharge: 2018-09-14 | Disposition: A | Discharge status: Home / Self Care | Payer: BLUE CROSS/BLUE SHIELD | Source: Ambulatory Visit | Attending: Neurosurgery | Admitting: Neurosurgery

## 2018-09-14 DIAGNOSIS — M5440 Lumbago with sciatica, unspecified side: Secondary | ICD-10-CM

## 2018-09-14 DIAGNOSIS — M545 Low back pain: Secondary | ICD-10-CM | POA: Diagnosis not present

## 2018-09-25 DIAGNOSIS — H00016 Hordeolum externum left eye, unspecified eyelid: Secondary | ICD-10-CM | POA: Diagnosis not present

## 2018-10-27 DIAGNOSIS — M48062 Spinal stenosis, lumbar region with neurogenic claudication: Secondary | ICD-10-CM | POA: Diagnosis not present

## 2018-10-27 DIAGNOSIS — M5440 Lumbago with sciatica, unspecified side: Secondary | ICD-10-CM | POA: Diagnosis not present

## 2019-01-26 DIAGNOSIS — M5440 Lumbago with sciatica, unspecified side: Secondary | ICD-10-CM | POA: Diagnosis not present

## 2019-02-12 IMAGING — CT CT L SPINE W/O CM
1 of 6 series · 6 of 14 positions shown, 8 images · non-contrast
Comparison: Plain films lumbar spine from [HOSPITAL] [REDACTED] 08/11/2018. MRI lumbar spine 03/18/2018.

CLINICAL DATA: Patient status post L3-4 laminectomy and fusion
07/03/2018. The patient suffered a fall on stairs approximately 3
weeks after surgery with new onset right side low back pain.

EXAM:
CT LUMBAR SPINE WITHOUT CONTRAST
TECHNIQUE: Multidetector CT imaging of the lumbar spine was performed without
intravenous contrast administration. Multiplanar CT image
reconstructions were also generated.

[Series 3: l spine soft (person_name) · axial · 0.32mm/px · z∈[-364,-193]mm · 6 of 81 slices shown, 8 images]
[im 12/81  soft-tissue]
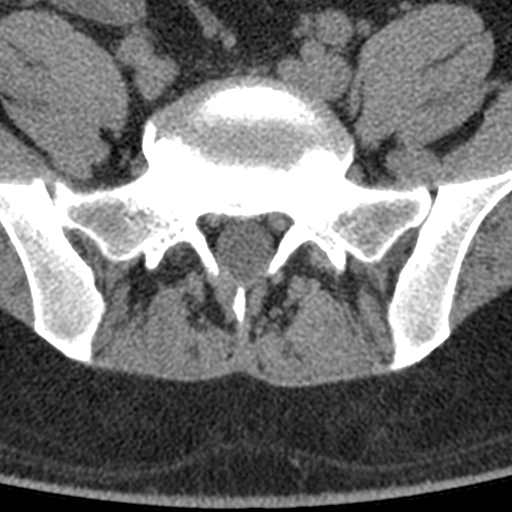
[im 12/81  bone]
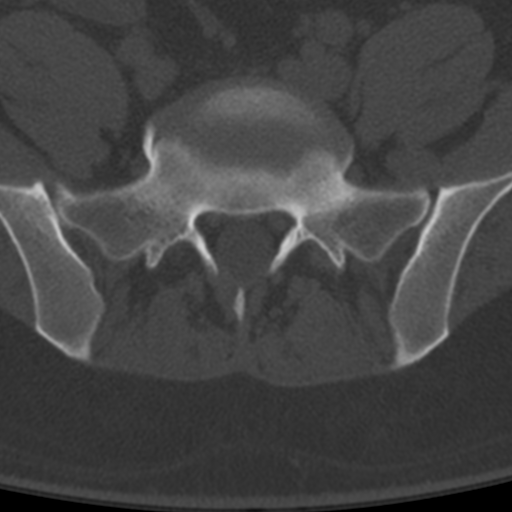
[im 23/81  bone]
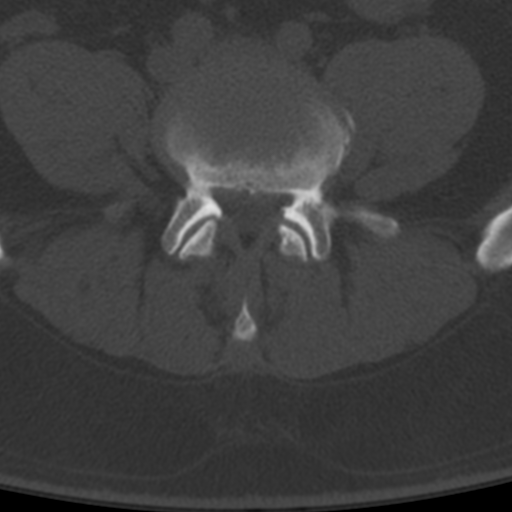
[im 35/81  bone]
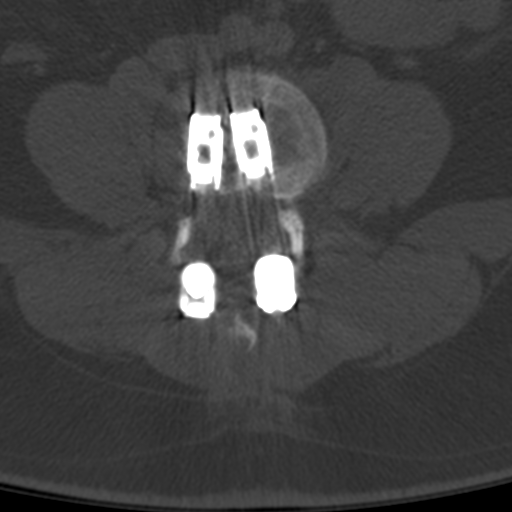
[im 46/81  bone]
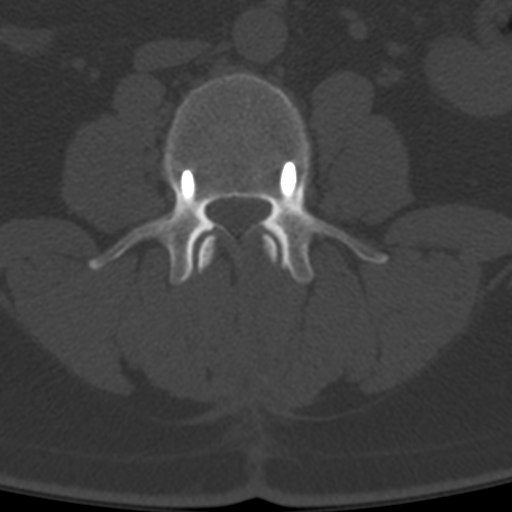
[im 58/81  soft-tissue]
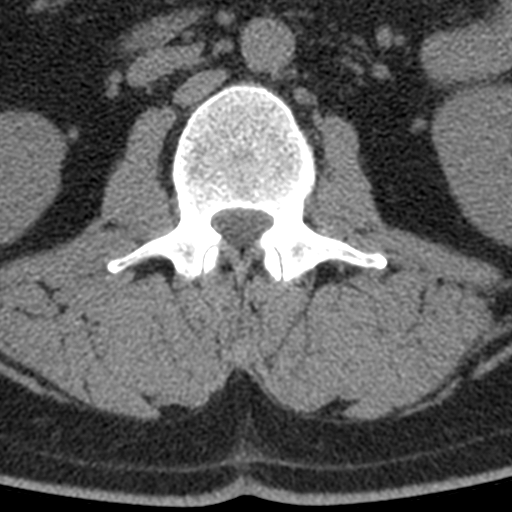
[im 58/81  bone]
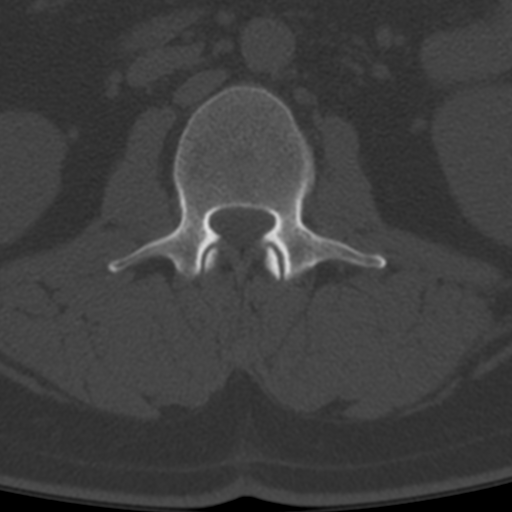
[im 69/81  bone]
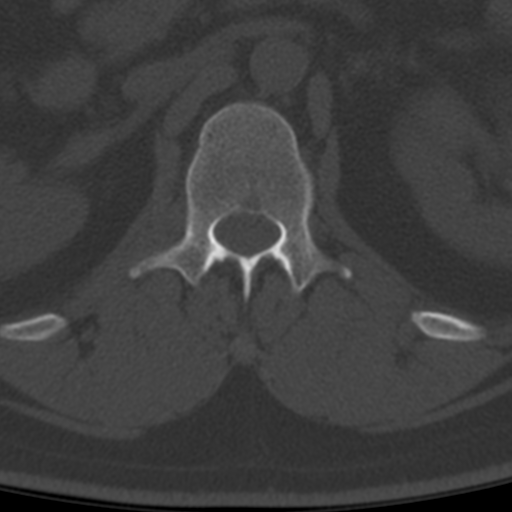

[6 of 14 positions shown; findings below may reference images not displayed]

FINDINGS: Segmentation: Standard.

Alignment: Normal.

Vertebrae: The patient is status post L3-4 laminectomy and fusion.
Pedicle screws are appropriately positioned and hardware is intact.
No evidence of loosening. Interbody spacer is also well positioned.
There is a tiny defect along the inferior margin of the right L3
pedicle has an appearance most consistent with a nutrient foramen
rather than a fracture. No fracture is identified.

Paraspinal and other soft tissues: Normal.

Disc levels: T12-L1 mild facet degenerative disease. Otherwise
negative.

L1-2: Mild facet degenerative change.  Otherwise negative.

L2-3: Mild facet degenerative change.  Otherwise negative.

L3-4: Status post laminectomy and fusion. The central canal and
foramina are widely patent. No subsidence of interbody spacer into
the endplates is identified. Areas of incorporation of bone graft
into the L3-4 endplates are seen.

L4-5: Shallow disc bulge causes mild central canal and lateral
recess narrowing. The foramina are open.

L5-S1: Shallow disc bulge without central canal or foraminal
stenosis.
IMPRESSION: Status post L3-4 laminectomy and fusion. Hardware is intact and
there is some incorporation of graft into the L3-4 endplates. Tiny
linear defect in the inferior margin of the right L3 pedicle has an
appearance most suggestive of a nutrient foramen rather than a
fracture. No acute bony abnormality is seen.

No marked change in a shallow disc bulge at L4-5 causing mild
central canal and bilateral lateral recess narrowing.

## 2019-07-19 DIAGNOSIS — R0981 Nasal congestion: Secondary | ICD-10-CM | POA: Diagnosis not present

## 2019-07-19 DIAGNOSIS — Z20828 Contact with and (suspected) exposure to other viral communicable diseases: Secondary | ICD-10-CM | POA: Diagnosis not present

## 2019-07-23 ENCOUNTER — Emergency Department (HOSPITAL_COMMUNITY): Payer: BC Managed Care – PPO

## 2019-07-23 ENCOUNTER — Encounter (HOSPITAL_COMMUNITY): Payer: Self-pay | Admitting: *Deleted

## 2019-07-23 ENCOUNTER — Emergency Department (HOSPITAL_COMMUNITY)
Admission: EM | Admit: 2019-07-23 | Discharge: 2019-07-23 | Disposition: A | Discharge status: Home / Self Care | Payer: BC Managed Care – PPO | Attending: Emergency Medicine | Admitting: Emergency Medicine

## 2019-07-23 DIAGNOSIS — Z79899 Other long term (current) drug therapy: Secondary | ICD-10-CM | POA: Insufficient documentation

## 2019-07-23 DIAGNOSIS — R55 Syncope and collapse: Secondary | ICD-10-CM

## 2019-07-23 DIAGNOSIS — R42 Dizziness and giddiness: Secondary | ICD-10-CM | POA: Diagnosis not present

## 2019-07-23 DIAGNOSIS — R110 Nausea: Secondary | ICD-10-CM | POA: Diagnosis not present

## 2019-07-23 DIAGNOSIS — U071 COVID-19: Secondary | ICD-10-CM | POA: Insufficient documentation

## 2019-07-23 LAB — COMPREHENSIVE METABOLIC PANEL
ALT: 34 U/L (ref 0–44)
AST: 29 U/L (ref 15–41)
Albumin: 3.6 g/dL (ref 3.5–5.0)
Alkaline Phosphatase: 37 U/L — ABNORMAL LOW (ref 38–126)
Anion gap: 10 (ref 5–15)
BUN: 11 mg/dL (ref 6–20)
CO2: 22 mmol/L (ref 22–32)
Calcium: 7.7 mg/dL — ABNORMAL LOW (ref 8.9–10.3)
Chloride: 110 mmol/L (ref 98–111)
Creatinine, Ser: 0.93 mg/dL (ref 0.61–1.24)
GFR calc Af Amer: >60 mL/min (ref >60)
GFR calc non Af Amer: >60 mL/min (ref >60)
Glucose, Bld: 118 mg/dL — ABNORMAL HIGH (ref 70–99)
Potassium: 3.6 mmol/L (ref 3.5–5.1)
Sodium: 142 mmol/L (ref 135–145)
Total Bilirubin: 0.7 mg/dL (ref 0.3–1.2)
Total Protein: 6.2 g/dL — ABNORMAL LOW (ref 6.5–8.1)

## 2019-07-23 LAB — CBC WITH DIFFERENTIAL/PLATELET
Abs Immature Granulocytes: 0.02 10*3/uL (ref 0.00–0.07)
Basophils Absolute: 0 10*3/uL (ref 0.0–0.1)
Basophils Relative: 0 %
Eosinophils Absolute: 0 10*3/uL (ref 0.0–0.5)
Eosinophils Relative: 0 %
HCT: 44.7 % (ref 39.0–52.0)
Hemoglobin: 14.3 g/dL (ref 13.0–17.0)
Immature Granulocytes: 1 %
Lymphocytes Relative: 16 %
Lymphs Abs: 0.6 10*3/uL — ABNORMAL LOW (ref 0.7–4.0)
MCH: 28.4 pg (ref 26.0–34.0)
MCHC: 32 g/dL (ref 30.0–36.0)
MCV: 88.7 fL (ref 80.0–100.0)
Monocytes Absolute: 0.5 10*3/uL (ref 0.1–1.0)
Monocytes Relative: 13 %
Neutro Abs: 2.8 10*3/uL (ref 1.7–7.7)
Neutrophils Relative %: 70 %
Platelets: 138 10*3/uL — ABNORMAL LOW (ref 150–400)
RBC: 5.04 MIL/uL (ref 4.22–5.81)
RDW: 13.8 % (ref 11.5–15.5)
WBC: 3.9 10*3/uL — ABNORMAL LOW (ref 4.0–10.5)
nRBC: 0 % (ref 0.0–0.2)

## 2019-07-23 LAB — LACTATE DEHYDROGENASE: LDH: 138 U/L (ref 98–192)

## 2019-07-23 LAB — PROCALCITONIN: Procalcitonin: <0.1 ng/mL

## 2019-07-23 LAB — FERRITIN: Ferritin: 97 ng/mL (ref 24–336)

## 2019-07-23 LAB — SARS CORONAVIRUS 2 (TAT 6-24 HRS): SARS Coronavirus 2: POSITIVE — AB

## 2019-07-23 LAB — LACTIC ACID, PLASMA: Lactic Acid, Venous: 1.3 mmol/L (ref 0.5–1.9)

## 2019-07-23 LAB — TRIGLYCERIDES: Triglycerides: 87 mg/dL (ref <150)

## 2019-07-23 LAB — D-DIMER, QUANTITATIVE: D-Dimer, Quant: 0.46 ug/mL-FEU (ref 0.00–0.50)

## 2019-07-23 LAB — C-REACTIVE PROTEIN: CRP: <0.8 mg/dL (ref <1.0)

## 2019-07-23 LAB — FIBRINOGEN: Fibrinogen: 425 mg/dL (ref 210–475)

## 2019-07-23 MED ORDER — ONDANSETRON HCL 4 MG/2ML IJ SOLN
4.0000 mg | Freq: Once | INTRAMUSCULAR | Status: AC
  Administered 2019-07-23: 4 mg via INTRAVENOUS
  Filled 2019-07-23: qty 2

## 2019-07-23 MED ORDER — SODIUM CHLORIDE 0.9 % IV SOLN
1000.0000 mL | INTRAVENOUS | Status: DC
  Administered 2019-07-23: 1000 mL via INTRAVENOUS

## 2019-07-23 MED ORDER — SODIUM CHLORIDE 0.9 % IV BOLUS
1000.0000 mL | Freq: Once | INTRAVENOUS | Status: AC
  Administered 2019-07-23: 1000 mL via INTRAVENOUS

## 2019-07-23 MED ORDER — ONDANSETRON HCL 4 MG PO TABS: 4.0000 mg | ORAL_TABLET | Freq: Three times a day (TID) | ORAL | 0 refills | Status: AC | PRN

## 2019-07-23 NOTE — ED Notes (Signed)
Unable to complete orthostatic vitals. Pt unable to stand long enough to obtain blood pressure.

## 2019-07-23 NOTE — Discharge Instructions (Signed)
Your labs are reassuring.  Take zofran as needed for nausea.  Stay hydrated and get plenty of rest.  Return if you develop persistent vomiting or if you have significant shortness of breath.

## 2019-07-23 NOTE — ED Triage Notes (Signed)
Per EMS, pt has nausea, vomiting, diarhea. Pt had near syncopal episode this morning, wife called 911. Pt tested positive for covid 4 days ago. Pt denies couch or shortness of breath. Pt feels like he will pass out if sitting up. Pt vomited once with EMS. Pt given 4 mg zofran, and fluids started.   BP 128/86 HR 62 CBG 143 Temp 96.8 SpO2 95%

## 2019-07-23 NOTE — ED Notes (Signed)
Unable to obtain 2nd set of blood cultures, 2 attempts made.

## 2019-07-23 NOTE — ED Provider Notes (Signed)
[facility] DEPT Provider Note   CSN: [id number] Arrival date & time: 07/23/19  0932     History   Chief Complaint Chief Complaint  Patient presents with  . Loss of Consciousness    COVID positive  . Nausea    HPI Jeffrey Myers is a 38 y.o. male.     The history is provided by the patient. No language interpreter was used.  Loss of Consciousness    38 year old male brought here via EMS for evaluation of near syncope.  Patient reports 6 days ago his wife was sick with cold symptoms.  The next day he also developed body aches, chills, some cough, chest pressure, feeling nauseous and vomiting.  He subsequently test positive for COVID-19 as well as his wife.  This morning, when he was using the bathroom, patient felt very nauseous subsequently sat on the commode and had a brief syncopal episode without falling onto the ground.  Wife was concerned, and contact EMS.  At this time patient states he feels a week and tired.  He also endorsed loss of taste but not loss of smell.  He denies any significant fever.  Denies hemoptysis.  He denies alcohol or tobacco use.  Past Medical History:  Diagnosis Date  . Arthritis   . Broken toe    At age 89  . History of broken finger     Patient Active Problem List   Diagnosis Date Noted  . HNP (herniated nucleus pulposus), lumbar 07/03/2018    History reviewed. No pertinent surgical history.      Home Medications    Prior to Admission medications   Medication Sig Start Date End Date Taking? Authorizing Provider  cyclobenzaprine (FLEXERIL) 10 MG tablet Take 1 tablet (10 mg total) by mouth 3 (three) times daily as needed for muscle spasms. 07/04/18   Kary Kos, MD  oxyCODONE 10 MG TABS Take 1 tablet (10 mg total) by mouth every 3 (three) hours as needed for severe pain ((score 7 to 10)). 07/04/18   Kary Kos, MD    Family History No family history on file.  Social History Social History    Tobacco Use  . Smoking status: Never Smoker  . Smokeless tobacco: Never Used  Substance Use Topics  . Alcohol use: Not on file  . Drug use: Not on file     Allergies   Patient has no known allergies.   Review of Systems Review of Systems  Cardiovascular: Positive for syncope.  All other systems reviewed and are negative.    Physical Exam Updated Vital Signs BP 120/80   Pulse 60   Temp 98.3 F (36.8 C) (Oral)   Resp 18   Ht 5\' 11"  (1.803 m)   Wt 106.1 kg   SpO2 98%   BMI 32.64 kg/m   Physical Exam Vitals signs and nursing note reviewed.  Constitutional:      General: He is not in acute distress.    Appearance: He is well-developed.  HENT:     Head: Atraumatic.  Eyes:     Conjunctiva/sclera: Conjunctivae normal.  Neck:     Musculoskeletal: Neck supple.  Cardiovascular:     Rate and Rhythm: Normal rate and regular rhythm.     Pulses: Normal pulses.     Heart sounds: Normal heart sounds.  Pulmonary:     Effort: Pulmonary effort is normal.     Breath sounds: Normal breath sounds.  Abdominal:     Palpations: Abdomen is  soft.     Tenderness: There is no abdominal tenderness.  Skin:    Findings: No rash.  Neurological:     Mental Status: He is alert and oriented to person, place, and time.  Psychiatric:        Mood and Affect: Mood normal.      ED Treatments / Results  Labs (all labs ordered are listed, but only abnormal results are displayed) Labs Reviewed  CBC WITH DIFFERENTIAL/PLATELET - Abnormal; Notable for the following components:      Result Value   WBC 3.9 (*)    Platelets 138 (*)    Lymphs Abs 0.6 (*)    All other components within normal limits  COMPREHENSIVE METABOLIC PANEL - Abnormal; Notable for the following components:   Glucose, Bld 118 (*)    Calcium 7.7 (*)    Total Protein 6.2 (*)    Alkaline Phosphatase 37 (*)    All other components within normal limits  SARS CORONAVIRUS 2 (TAT 6-24 HRS)  CULTURE, BLOOD (ROUTINE X 2)   CULTURE, BLOOD (ROUTINE X 2)  LACTIC ACID, PLASMA  D-DIMER, QUANTITATIVE (NOT AT [facility])  PROCALCITONIN  LACTATE DEHYDROGENASE  FERRITIN  TRIGLYCERIDES  FIBRINOGEN  C-REACTIVE PROTEIN  LACTIC ACID, PLASMA    EKG EKG Interpretation  Date/Time:  Friday July 23 2019 08:10:52 EST Ventricular Rate:  64 PR Interval:    QRS Duration: 91 QT Interval:  395 QTC Calculation: 408 R Axis:   55 Text Interpretation: Sinus rhythm RSR' in V1 or V2, right VCD or RVH No STEMI Confirmed by Alvester Chou [phone]) on 07/23/2019 10:24:26 AM   Radiology Dg Chest Port 1 View  Result Date: 07/23/2019 CLINICAL DATA:  Headache and body aches. Near syncope. COVID-19 positive EXAM: PORTABLE CHEST 1 VIEW COMPARISON:  None. FINDINGS: Lungs are clear. Heart is upper normal in size with pulmonary vascularity normal. No adenopathy. No bone lesions. IMPRESSION: No edema or consolidation. Heart upper normal in size. No evident adenopathy. Electronically Signed   By: Bretta Bang III M.D.   On: 07/23/2019 08:50    Procedures Procedures (including critical care time)  Medications Ordered in ED Medications  0.9 %  sodium chloride infusion (0 mLs Intravenous Stopped 07/23/19 1249)  sodium chloride 0.9 % bolus 1,000 mL (0 mLs Intravenous Stopped 07/23/19 1249)  ondansetron (ZOFRAN) injection 4 mg (4 mg Intravenous Given 07/23/19 0929)  ondansetron (ZOFRAN) injection 4 mg (4 mg Intravenous Given 07/23/19 1249)     Initial Impression / Assessment and Plan / ED Course  I have reviewed the triage vital signs and the nursing notes.  Pertinent labs & imaging results that were available during my care of the patient were reviewed by me and considered in my medical decision making (see chart for details).        BP 111/73   Pulse 77   Temp 98.3 F (36.8 C) (Oral)   Resp (!) 21   Ht 5\' 11"  (1.803 m)   Wt 106.1 kg   SpO2 97%   BMI 32.64 kg/m    Final Clinical Impressions(s) / ED Diagnoses   Final  diagnoses:  COVID-19  Near syncope    ED Discharge Orders         Ordered    ondansetron (ZOFRAN) 4 MG tablet  Every 8 hours PRN     07/23/19 1430         8:31 AM Patient recently tested positive for COVID-19 here with near syncope.  Patient overall well-appearing,  vital signs stable.  Work-up initiated.  IV fluid given.  No fever, no hypoxia.  2:32 PM Normal orthostatic vital sign, vital signs stable.  Patient endorsed nausea which improved with Zofran.  At this time he is stable for discharge.  Return precaution discussed.  Recommend self quarantine until symptoms resolved.    Jeffrey Myers was evaluated in Emergency Department on 07/23/2019 for the symptoms described in the history of present illness. He was evaluated in the context of the global COVID-19 pandemic, which necessitated consideration that the patient might be at risk for infection with the SARS-CoV-2 virus that causes COVID-19. Institutional protocols and algorithms that pertain to the evaluation of patients at risk for COVID-19 are in a state of rapid change based on information released by regulatory bodies including the CDC and federal and state organizations. These policies and algorithms were followed during the patient's care in the ED.    Fayrene Helperran, Shaylinn Hladik, PA-C 07/23/19 1434    Terald Sleeperrifan, Matthew J, MD 07/24/19 1149

## 2019-07-28 DIAGNOSIS — R0602 Shortness of breath: Secondary | ICD-10-CM | POA: Diagnosis not present

## 2019-07-28 DIAGNOSIS — R5383 Other fatigue: Secondary | ICD-10-CM | POA: Diagnosis not present

## 2019-07-28 DIAGNOSIS — U071 COVID-19: Secondary | ICD-10-CM | POA: Diagnosis not present

## 2019-07-28 LAB — CULTURE, BLOOD (ROUTINE X 2): Culture: NO GROWTH

## 2019-09-27 DIAGNOSIS — E669 Obesity, unspecified: Secondary | ICD-10-CM | POA: Diagnosis not present

## 2019-09-27 DIAGNOSIS — R635 Abnormal weight gain: Secondary | ICD-10-CM | POA: Diagnosis not present

## 2019-10-12 DIAGNOSIS — E669 Obesity, unspecified: Secondary | ICD-10-CM | POA: Diagnosis not present

## 2019-10-12 DIAGNOSIS — R635 Abnormal weight gain: Secondary | ICD-10-CM | POA: Diagnosis not present

## 2019-10-12 DIAGNOSIS — Z13220 Encounter for screening for lipoid disorders: Secondary | ICD-10-CM | POA: Diagnosis not present

## 2020-04-11 DIAGNOSIS — M545 Low back pain: Secondary | ICD-10-CM | POA: Diagnosis not present

## 2020-04-11 DIAGNOSIS — M5416 Radiculopathy, lumbar region: Secondary | ICD-10-CM | POA: Diagnosis not present

## 2020-04-19 DIAGNOSIS — Z01818 Encounter for other preprocedural examination: Secondary | ICD-10-CM | POA: Diagnosis not present

## 2020-04-27 DIAGNOSIS — M545 Low back pain: Secondary | ICD-10-CM | POA: Diagnosis not present

## 2020-05-10 DIAGNOSIS — L03316 Cellulitis of umbilicus: Secondary | ICD-10-CM | POA: Diagnosis not present

## 2020-05-11 ENCOUNTER — Encounter (HOSPITAL_BASED_OUTPATIENT_CLINIC_OR_DEPARTMENT_OTHER): Payer: Self-pay | Admitting: Emergency Medicine

## 2020-05-11 ENCOUNTER — Other Ambulatory Visit: Payer: Self-pay

## 2020-05-11 ENCOUNTER — Emergency Department (HOSPITAL_BASED_OUTPATIENT_CLINIC_OR_DEPARTMENT_OTHER)
Admission: EM | Admit: 2020-05-11 | Discharge: 2020-05-11 | Disposition: A | Discharge status: Home / Self Care | Payer: BC Managed Care – PPO | Attending: Emergency Medicine | Admitting: Emergency Medicine

## 2020-05-11 ENCOUNTER — Emergency Department (HOSPITAL_BASED_OUTPATIENT_CLINIC_OR_DEPARTMENT_OTHER)
Admission: EM | Admit: 2020-05-11 | Discharge: 2020-05-11 | Disposition: A | Discharge status: Home / Self Care | Payer: BC Managed Care – PPO | Source: Home / Self Care | Attending: Emergency Medicine | Admitting: Emergency Medicine

## 2020-05-11 ENCOUNTER — Emergency Department (HOSPITAL_BASED_OUTPATIENT_CLINIC_OR_DEPARTMENT_OTHER): Payer: BC Managed Care – PPO

## 2020-05-11 DIAGNOSIS — M79662 Pain in left lower leg: Secondary | ICD-10-CM | POA: Insufficient documentation

## 2020-05-11 DIAGNOSIS — I82402 Acute embolism and thrombosis of unspecified deep veins of left lower extremity: Secondary | ICD-10-CM | POA: Insufficient documentation

## 2020-05-11 DIAGNOSIS — M79605 Pain in left leg: Secondary | ICD-10-CM | POA: Diagnosis not present

## 2020-05-11 DIAGNOSIS — I824Y2 Acute embolism and thrombosis of unspecified deep veins of left proximal lower extremity: Secondary | ICD-10-CM

## 2020-05-11 DIAGNOSIS — I82442 Acute embolism and thrombosis of left tibial vein: Secondary | ICD-10-CM | POA: Diagnosis not present

## 2020-05-11 DIAGNOSIS — Z5321 Procedure and treatment not carried out due to patient leaving prior to being seen by health care provider: Secondary | ICD-10-CM | POA: Insufficient documentation

## 2020-05-11 DIAGNOSIS — Z7901 Long term (current) use of anticoagulants: Secondary | ICD-10-CM | POA: Insufficient documentation

## 2020-05-11 MED ORDER — APIXABAN 2.5 MG PO TABS: 10.0000 mg | ORAL_TABLET | Freq: Once | ORAL | Status: DC

## 2020-05-11 MED ORDER — APIXABAN (ELIQUIS) VTE STARTER PACK (10MG AND 5MG): ORAL_TABLET | ORAL | 0 refills | Status: AC

## 2020-05-11 MED ORDER — APIXABAN (ELIQUIS) EDUCATION KIT FOR DVT/PE PATIENTS: PACK | Freq: Once | Status: DC

## 2020-05-11 MED FILL — ELIQUIS STARTER PACK 5 MG T: 5 | 30 days supply | Qty: 74 | Fill #0

## 2020-05-11 NOTE — ED Triage Notes (Signed)
[place] Woke up today with L calf pain. Concerned for DVT

## 2020-05-11 NOTE — ED Provider Notes (Signed)
[facility] EMERGENCY DEPARTMENT Provider Note   CSN: [id number] Arrival date & time: 05/11/20  7106     History Chief Complaint  Patient presents with  . Leg Pain    Jeffrey Myers is a 39 y.o. male.  Patient with recent gynecomastia reduction surgery [place].  Now with left leg pain.  Concern for blood clot.  States that he was on blood thinners post surgery but no longer on them.  Denies any shortness of breath, abdominal pain, chest pain.  The history is provided by the patient.  Leg Pain Location:  Leg Time since incident:  1 day Leg location:  L lower leg Pain details:    Quality:  Aching   Radiates to:  Does not radiate   Severity:  Mild   Onset quality:  Gradual   Timing:  Intermittent   Progression:  Waxing and waning Chronicity:  New Relieved by:  Nothing Worsened by:  Nothing Associated symptoms: no back pain, no decreased ROM, no fever, no itching, no muscle weakness, no neck pain, no numbness, no stiffness, no swelling and no tingling        Past Medical History:  Diagnosis Date  . Arthritis   . Broken toe    At age 18  . History of broken finger     Patient Active Problem List   Diagnosis Date Noted  . HNP (herniated nucleus pulposus), lumbar 07/03/2018    Past Surgical History:  Procedure Laterality Date  . ABDOMINAL SURGERY         No family history on file.  Social History   Tobacco Use  . Smoking status: Never Smoker  . Smokeless tobacco: Never Used  Vaping Use  . Vaping Use: Never used  Substance Use Topics  . Alcohol use: Not Currently  . Drug use: Never    Home Medications Prior to Admission medications   Medication Sig Start Date End Date Taking? Authorizing Provider  ondansetron (ZOFRAN) 4 MG tablet Take 1 tablet (4 mg total) by mouth every 8 (eight) hours as needed for nausea or vomiting. 07/23/19   Fayrene Helper, PA-C    Allergies    Patient has no known allergies.  Review of Systems   Review of  Systems  Constitutional: Negative for fever.  Respiratory: Negative for cough and shortness of breath.   Cardiovascular: Negative for chest pain.  Gastrointestinal: Negative for abdominal pain.  Musculoskeletal: Positive for myalgias. Negative for arthralgias, back pain, gait problem, joint swelling, neck pain, neck stiffness and stiffness.  Skin: Negative for color change, itching, pallor, rash and wound.    Physical Exam Updated Vital Signs  ED Triage Vitals  Enc Vitals Group     BP 05/11/20 0730 127/80     Pulse Rate 05/11/20 0730 99     Resp 05/11/20 0730 18     Temp 05/11/20 0730 98.4 F (36.9 C)     Temp Source 05/11/20 0730 Oral     SpO2 05/11/20 0730 99 %     Weight 05/11/20 0729 234 lb (106.1 kg)     Height 05/11/20 0729 5\' 11"  (1.803 m)     Head Circumference --      Peak Flow --      Pain Score 05/11/20 0729 1     Pain Loc --      Pain Edu? --      Excl. in GC? --     Physical Exam Constitutional:      General:  He is not in acute distress.    Appearance: He is not ill-appearing.  Cardiovascular:     Pulses: Normal pulses.  Pulmonary:     Effort: Pulmonary effort is normal.  Musculoskeletal:        General: Tenderness (TTP to left calf) present. No swelling. Normal range of motion.     Right lower leg: No edema.     Left lower leg: No edema.  Skin:    General: Skin is warm.     Capillary Refill: Capillary refill takes less than 2 seconds.     Findings: No erythema or rash.  Neurological:     General: No focal deficit present.     Mental Status: He is alert.     Sensory: No sensory deficit.     Motor: No weakness.     ED Results / Procedures / Treatments   Labs (all labs ordered are listed, but only abnormal results are displayed) Labs Reviewed - No data to display  EKG None  Radiology No results found.  Procedures Procedures (including critical care time)  Medications Ordered in ED Medications - No data to display  ED Course  I have  reviewed the triage vital signs and the nursing notes.  Pertinent labs & imaging results that were available during my care of the patient were reviewed by me and considered in my medical decision making (see chart for details).    MDM Rules/Calculators/A&P                          Jeffrey Myers is a 39 year old male who presents to the ED with left calf pain.  Patient concern for DVT.  Normal vitals.  No fever.  [place].  Was on blood thinner post surgery for clot prophylaxis but no longer on it.  Not having any respiratory symptoms.  Has had left calf pain and tender in that area.  Normal pulses in his lower extremities.  No leg swelling.  No erythema and no concern for infectious process.  Will get DVT ultrasound.  Patient got ultrasound.  However he wanted to leave prior to results resulting.  No follow-up ultrasound on MyChart.  Will flag it myself and follow-up if positive.  This chart was dictated using voice recognition software.  Despite best efforts to proofread,  errors can occur which can change the documentation meaning.   Final Clinical Impression(s) / ED Diagnoses Final diagnoses:  Left leg pain    Rx / DC Orders ED Discharge Orders    None       Virgina Norfolk, DO 05/11/20 8416

## 2020-05-11 NOTE — Discharge Instructions (Addendum)
Follow-up your DVT study online however I will call you back if study is positive.  If you do not hear from me, DVT study was negative.  Recommend Tylenol Motrin.

## 2020-05-11 NOTE — ED Triage Notes (Signed)
Had an [place] earlier today. Confirmed DVT to L leg.

## 2020-05-11 NOTE — ED Provider Notes (Addendum)
Contacted patient on the phone.  DVT study positive for blood clot in the left posterior tibial vein.  He has returned to the ED.  We will start him on Eliquis.  Will prescribe starter pack.  Education given.  Will follow up with primary care doctor.   Virgina Norfolk, DO 05/11/20 5374    Virgina Norfolk, DO 05/11/20 1542

## 2020-05-11 NOTE — ED Notes (Signed)
Patient transported to Ultrasound 

## 2020-05-14 DIAGNOSIS — Z4802 Encounter for removal of sutures: Secondary | ICD-10-CM | POA: Diagnosis not present

## 2020-05-14 DIAGNOSIS — L03316 Cellulitis of umbilicus: Secondary | ICD-10-CM | POA: Diagnosis not present

## 2020-05-29 DIAGNOSIS — I82402 Acute embolism and thrombosis of unspecified deep veins of left lower extremity: Secondary | ICD-10-CM | POA: Diagnosis not present

## 2020-05-29 DIAGNOSIS — Z4802 Encounter for removal of sutures: Secondary | ICD-10-CM | POA: Diagnosis not present

## 2020-09-18 DIAGNOSIS — Z01818 Encounter for other preprocedural examination: Secondary | ICD-10-CM | POA: Diagnosis not present

## 2020-09-18 DIAGNOSIS — Z86718 Personal history of other venous thrombosis and embolism: Secondary | ICD-10-CM | POA: Diagnosis not present

## 2020-09-18 DIAGNOSIS — E669 Obesity, unspecified: Secondary | ICD-10-CM | POA: Diagnosis not present

## 2021-06-27 DIAGNOSIS — Z13220 Encounter for screening for lipoid disorders: Secondary | ICD-10-CM | POA: Diagnosis not present

## 2021-06-27 DIAGNOSIS — Z23 Encounter for immunization: Secondary | ICD-10-CM | POA: Diagnosis not present

## 2021-06-27 DIAGNOSIS — Z131 Encounter for screening for diabetes mellitus: Secondary | ICD-10-CM | POA: Diagnosis not present

## 2021-06-27 DIAGNOSIS — Z0000 Encounter for general adult medical examination without abnormal findings: Secondary | ICD-10-CM | POA: Diagnosis not present

## 2022-03-03 ENCOUNTER — Emergency Department (HOSPITAL_BASED_OUTPATIENT_CLINIC_OR_DEPARTMENT_OTHER): Payer: BC Managed Care – PPO

## 2022-03-03 ENCOUNTER — Encounter (HOSPITAL_BASED_OUTPATIENT_CLINIC_OR_DEPARTMENT_OTHER): Payer: Self-pay | Admitting: Emergency Medicine

## 2022-03-03 ENCOUNTER — Emergency Department (HOSPITAL_BASED_OUTPATIENT_CLINIC_OR_DEPARTMENT_OTHER)
Admission: EM | Admit: 2022-03-03 | Discharge: 2022-03-03 | Disposition: A | Discharge status: Home / Self Care | Payer: BC Managed Care – PPO | Attending: Emergency Medicine | Admitting: Emergency Medicine

## 2022-03-03 DIAGNOSIS — Z7901 Long term (current) use of anticoagulants: Secondary | ICD-10-CM | POA: Diagnosis not present

## 2022-03-03 DIAGNOSIS — M79661 Pain in right lower leg: Secondary | ICD-10-CM

## 2022-03-03 DIAGNOSIS — Z86718 Personal history of other venous thrombosis and embolism: Secondary | ICD-10-CM | POA: Diagnosis not present

## 2022-03-03 DIAGNOSIS — M79662 Pain in left lower leg: Secondary | ICD-10-CM | POA: Insufficient documentation

## 2022-03-03 HISTORY — DX: Acute embolism and thrombosis of unspecified deep veins of unspecified lower extremity: I82.409

## 2022-03-03 NOTE — ED Triage Notes (Signed)
Pt reports pain to RLE (lateral , just below knee)

## 2022-03-03 NOTE — ED Provider Notes (Signed)
[facility] EMERGENCY DEPARTMENT Provider Note   CSN: [id number] Arrival date & time: 03/03/22  2019     History  Chief Complaint  Patient presents with   Leg Pain    Jeffrey Myers is a 41 y.o. male.  Patient here with right calf pain for last several days.  He has history of blood clot in the left lower leg.  No longer on blood thinners.  Denies any fevers or chills.  He has increased his physical activity.  He is running 3 miles a day but recently started to do some sprint work.  After 1 of these training sessions he has developed some pain to the right calf.  Denies any weakness or numbness.  No chest pain or shortness of breath or any other associated symptoms.  Walking makes it worse.  Rest makes it better.  The history is provided by the patient.       Home Medications Prior to Admission medications   Medication Sig Start Date End Date Taking? Authorizing Provider  APIXABAN (ELIQUIS) VTE STARTER PACK (10MG  AND 5MG ) Take as directed on package: start with two-5mg  tablets twice daily for 7 days. On day 8, switch to one-5mg  tablet twice daily. 05/11/20   Epic Tribbett, DO  ondansetron (ZOFRAN) 4 MG tablet Take 1 tablet (4 mg total) by mouth every 8 (eight) hours as needed for nausea or vomiting. 07/23/19   05/13/20, PA-C      Allergies    Patient has no known allergies.    Review of Systems   Review of Systems  Physical Exam Updated Vital Signs BP (!) 142/98 (BP Location: Right Arm)   Pulse 67   Temp 98 F (36.7 C)   Resp 18   Ht 5\' 11"  (1.803 m)   Wt 104.3 kg   SpO2 98%   BMI 32.08 kg/m  Physical Exam Vitals and nursing note reviewed.  Constitutional:      General: He is not in acute distress.    Appearance: He is well-developed. He is not ill-appearing.  HENT:     Head: Normocephalic and atraumatic.     Nose: Nose normal.     Mouth/Throat:     Mouth: Mucous membranes are moist.  Eyes:     Extraocular Movements: Extraocular movements  intact.     Conjunctiva/sclera: Conjunctivae normal.     Pupils: Pupils are equal, round, and reactive to light.  Cardiovascular:     Rate and Rhythm: Normal rate and regular rhythm.     Pulses: Normal pulses.     Heart sounds: No murmur heard. Pulmonary:     Effort: Pulmonary effort is normal. No respiratory distress.     Breath sounds: Normal breath sounds.  Abdominal:     Palpations: Abdomen is soft.     Tenderness: There is no abdominal tenderness.  Musculoskeletal:        General: Tenderness present. No swelling.     Cervical back: Neck supple.     Comments: Tenderness to the right calf  Skin:    General: Skin is warm and dry.     Capillary Refill: Capillary refill takes less than 2 seconds.  Neurological:     General: No focal deficit present.     Mental Status: He is alert.     Sensory: No sensory deficit.     Motor: No weakness.  Psychiatric:        Mood and Affect: Mood normal.     ED Results /  Procedures / Treatments   Labs (all labs ordered are listed, but only abnormal results are displayed) Labs Reviewed - No data to display  EKG None  Radiology US Venous Img Lower Right (DVT Study)  Result Date: 03/03/2022 CLINICAL DATA:  r/o clot, right lower extremity pain. EXAM: Right LOWER EXTREMITY VENOUS DOPPLER ULTRASOUND TECHNIQUE: Gray-scale sonography with graded compression, as well as color Doppler and duplex ultrasound were performed to evaluate the lower extremity deep venous systems from the level of the common femoral vein and including the common femoral, femoral, profunda femoral, popliteal and calf veins including the posterior tibial, peroneal and gastrocnemius veins when visible. The superficial great saphenous vein was also interrogated. Spectral Doppler was utilized to evaluate flow at rest and with distal augmentation maneuvers in the common femoral, femoral and popliteal veins. COMPARISON:  None Available. FINDINGS: Contralateral Common Femoral Vein:  Respiratory phasicity is normal and symmetric with the symptomatic side. No evidence of thrombus. Normal compressibility. Common Femoral Vein: No evidence of thrombus. Normal compressibility, respiratory phasicity and response to augmentation. Saphenofemoral Junction: No evidence of thrombus. Normal compressibility and flow on color Doppler imaging. Profunda Femoral Vein: No evidence of thrombus. Normal compressibility and flow on color Doppler imaging. Femoral Vein: No evidence of thrombus. Normal compressibility, respiratory phasicity and response to augmentation. Popliteal Vein: No evidence of thrombus. Normal compressibility, respiratory phasicity and response to augmentation. Calf Veins: No evidence of thrombus. Normal compressibility and flow on color Doppler imaging. Superficial Great Saphenous Vein: No evidence of thrombus. Normal compressibility. Venous Reflux:  None. Other Findings:  None. IMPRESSION: No evidence of deep venous thrombosis of the right lower extremity. Electronically Signed   By: Tish Frederickson M.D.   On: 03/03/2022 21:09    Procedures Procedures    Medications Ordered in ED Medications - No data to display  ED Course/ Medical Decision Making/ A&P                           Medical Decision Making Amount and/or Complexity of Data Reviewed Radiology: ordered.   Jeffrey Myers is here with right calf pain.  History of DVT.  Not on blood thinners anymore.  Normal vitals.  No fever.  Differential diagnosis is musculoskeletal strain versus fracture versus DVT.  Neurovascular neuromuscular he is intact.  He has good pulses in his lower extremities.  Have no concern for peripheral arterial occlusion.  Given his history and physical my suspicion is that this is a calf strain.  But will get ultrasound and x-ray of the right lower leg.  Per my review and interpretation of x-ray no fracture.  DVT study negative.  Overall suspect muscle strain, soft tissue injury.  Recommend  Tylenol, ibuprofen, rest.  Recommend ice.  We will have him follow-up with sports medicine.  Discharged in good condition.  This chart was dictated using voice recognition software.  Despite best efforts to proofread,  errors can occur which can change the documentation meaning.         Final Clinical Impression(s) / ED Diagnoses Final diagnoses:  Right calf pain    Rx / DC Orders ED Discharge Orders     None         Virgina Norfolk, DO 03/03/22 2154

## 2022-03-07 ENCOUNTER — Ambulatory Visit (INDEPENDENT_AMBULATORY_CARE_PROVIDER_SITE_OTHER): Payer: BC Managed Care – PPO | Admitting: Family Medicine

## 2022-03-07 ENCOUNTER — Encounter: Payer: Self-pay | Admitting: Family Medicine

## 2022-03-07 VITALS — BP 126/88 | Ht 71.0 in | Wt 230.0 lb

## 2022-03-07 DIAGNOSIS — M5416 Radiculopathy, lumbar region: Secondary | ICD-10-CM | POA: Diagnosis not present

## 2022-03-07 MED ORDER — PREDNISONE 5 MG PO TABS: ORAL_TABLET | ORAL | 0 refills | Status: AC

## 2022-03-07 NOTE — Patient Instructions (Signed)
Nice to meet you Please try heat on the leg  Please try the exercises  Please let us know if you would like to try a custom orthotics  Please send me a message in MyChart with any questions or updates.  Please see me back in 4 weeks.   --Dr. Jordan Likes

## 2022-03-07 NOTE — Progress Notes (Signed)
  Jeffrey Myers - 41 y.o. male MRN [id number]  Date of birth: 19-Jan-1981  SUBJECTIVE:  Including CC & ROS.  No chief complaint on file.   Jeffrey Myers is a 41 y.o. male that is  presenting with right calf pain. Started about 3 weeks ago. Having altered sensation along the lateral aspect of the lateral lower leg. Worse with standing. No injury or trauma. History of spinal fusion.    Review of Systems See HPI   HISTORY: Past Medical, Surgical, Social, and Family History Reviewed & Updated per EMR.   Pertinent Historical Findings include:  Past Medical History:  Diagnosis Date   Arthritis    Broken toe    At age 40   DVT (deep venous thrombosis) (HCC)    History of broken finger     Past Surgical History:  Procedure Laterality Date   ABDOMINAL SURGERY       PHYSICAL EXAM:  VS: BP 126/88 (BP Location: Left Arm, Patient Position: Sitting)   Ht 5\' 11"  (1.803 m)   Wt 230 lb (104.3 kg)   BMI 32.08 kg/m  Physical Exam Gen: NAD, alert, cooperative with exam, well-appearing MSK:  Neurovascularly intact       ASSESSMENT & PLAN:   Lumbar radiculopathy Acutely occurring.  Pain over the lateral lower leg foot.  Has a history of spinal fusion from years ago.  Has some weakness with eversion on exam. -Counseled on home exercise therapy and supportive care. -Prednisone. -Green sport insoles. -Could consider physical therapy or further imaging.

## 2022-03-15 ENCOUNTER — Telehealth: Payer: Self-pay | Admitting: Family Medicine

## 2022-03-15 ENCOUNTER — Ambulatory Visit (HOSPITAL_BASED_OUTPATIENT_CLINIC_OR_DEPARTMENT_OTHER)
Admission: RE | Admit: 2022-03-15 | Discharge: 2022-03-15 | Disposition: A | Discharge status: Home / Self Care | Payer: BC Managed Care – PPO | Source: Ambulatory Visit | Attending: Family Medicine | Admitting: Family Medicine

## 2022-03-15 DIAGNOSIS — M5416 Radiculopathy, lumbar region: Secondary | ICD-10-CM | POA: Diagnosis not present

## 2022-03-15 DIAGNOSIS — M545 Low back pain, unspecified: Secondary | ICD-10-CM | POA: Diagnosis not present

## 2022-03-15 MED ORDER — HYDROCODONE-ACETAMINOPHEN 5-325 MG PO TABS: 1.0000 | ORAL_TABLET | Freq: Three times a day (TID) | ORAL | 0 refills | Status: AC | PRN

## 2022-03-15 NOTE — Telephone Encounter (Signed)
Ordered xray and provided norco.   Myra Rude, MD [facility] 03/15/2022, 11:44 AM

## 2022-03-15 NOTE — Telephone Encounter (Signed)
Pt cld states Prednisone did nothing to relieve Back pain & he is asking for pt to order :  1) Back  X-ray 2) Rx for muscle relaxer & pain   --Patient uses :  [id number] [place], [place] - [address] Phone:  [phone]  Fax:  [phone]     --Forwarding message to Dr.Schmitz for review  --glh

## 2022-03-15 NOTE — Telephone Encounter (Signed)
Pt informed of below.  

## 2022-03-16 DIAGNOSIS — M79604 Pain in right leg: Secondary | ICD-10-CM | POA: Diagnosis not present

## 2022-03-20 ENCOUNTER — Telehealth: Payer: Self-pay | Admitting: Family Medicine

## 2022-03-20 ENCOUNTER — Ambulatory Visit: Payer: BC Managed Care – PPO | Admitting: Family Medicine

## 2022-03-20 NOTE — Telephone Encounter (Signed)
Informed on results.   Myra Rude, MD [facility] 03/20/2022, 10:56 AM

## 2022-03-29 DIAGNOSIS — M5416 Radiculopathy, lumbar region: Secondary | ICD-10-CM | POA: Diagnosis not present

## 2022-04-09 DIAGNOSIS — M5416 Radiculopathy, lumbar region: Secondary | ICD-10-CM | POA: Diagnosis not present

## 2022-04-11 DIAGNOSIS — M5126 Other intervertebral disc displacement, lumbar region: Secondary | ICD-10-CM | POA: Diagnosis not present

## 2022-04-15 ENCOUNTER — Ambulatory Visit: Payer: BC Managed Care – PPO | Admitting: Family Medicine

## 2022-04-25 DIAGNOSIS — S9031XA Contusion of right foot, initial encounter: Secondary | ICD-10-CM | POA: Diagnosis not present

## 2022-04-25 DIAGNOSIS — S90811A Abrasion, right foot, initial encounter: Secondary | ICD-10-CM | POA: Diagnosis not present

## 2022-05-03 DIAGNOSIS — M5416 Radiculopathy, lumbar region: Secondary | ICD-10-CM | POA: Diagnosis not present

## 2022-05-30 DIAGNOSIS — M5126 Other intervertebral disc displacement, lumbar region: Secondary | ICD-10-CM | POA: Diagnosis not present

## 2022-06-13 DIAGNOSIS — Z6834 Body mass index (BMI) 34.0-34.9, adult: Secondary | ICD-10-CM | POA: Diagnosis not present

## 2022-06-13 DIAGNOSIS — M5126 Other intervertebral disc displacement, lumbar region: Secondary | ICD-10-CM | POA: Diagnosis not present

## 2022-06-27 DIAGNOSIS — M5440 Lumbago with sciatica, unspecified side: Secondary | ICD-10-CM | POA: Diagnosis not present

## 2022-06-28 DIAGNOSIS — M5440 Lumbago with sciatica, unspecified side: Secondary | ICD-10-CM | POA: Diagnosis not present

## 2022-07-01 DIAGNOSIS — M5440 Lumbago with sciatica, unspecified side: Secondary | ICD-10-CM | POA: Diagnosis not present

## 2022-07-08 DIAGNOSIS — M5440 Lumbago with sciatica, unspecified side: Secondary | ICD-10-CM | POA: Diagnosis not present

## 2022-07-15 DIAGNOSIS — M5440 Lumbago with sciatica, unspecified side: Secondary | ICD-10-CM | POA: Diagnosis not present

## 2022-07-23 DIAGNOSIS — M5440 Lumbago with sciatica, unspecified side: Secondary | ICD-10-CM | POA: Diagnosis not present

## 2022-07-29 DIAGNOSIS — M5440 Lumbago with sciatica, unspecified side: Secondary | ICD-10-CM | POA: Diagnosis not present

## 2022-10-02 ENCOUNTER — Telehealth: Payer: Self-pay | Admitting: *Deleted

## 2022-10-02 NOTE — Patient Outreach (Signed)
  Care Coordination   10/02/2022 Name: ZABDIEL DRIPPS MRN: [id number] DOB: 05/11/1981   Care Coordination Outreach Attempts:  An unsuccessful telephone outreach was attempted today to offer the patient information about available care coordination services as a benefit of their health plan.   Follow Up Plan:  Additional outreach attempts will be made to offer the patient care coordination information and services.   Encounter Outcome:  No Answer   Care Coordination Interventions:  No, not indicated    Raina Mina, RN Care Management Coordinator [place] Office [phone]

## 2022-12-30 ENCOUNTER — Encounter: Payer: Self-pay | Admitting: *Deleted

## 2023-04-11 ENCOUNTER — Other Ambulatory Visit: Payer: Self-pay | Admitting: Family Medicine

## 2023-04-11 ENCOUNTER — Ambulatory Visit (INDEPENDENT_AMBULATORY_CARE_PROVIDER_SITE_OTHER): Payer: No Typology Code available for payment source

## 2023-04-11 DIAGNOSIS — Z01818 Encounter for other preprocedural examination: Secondary | ICD-10-CM | POA: Diagnosis not present

## 2023-04-25 ENCOUNTER — Inpatient Hospital Stay: Payer: No Typology Code available for payment source

## 2023-04-25 ENCOUNTER — Encounter: Payer: Self-pay | Admitting: Internal Medicine

## 2023-04-25 ENCOUNTER — Telehealth: Payer: Self-pay

## 2023-04-25 ENCOUNTER — Inpatient Hospital Stay: Payer: No Typology Code available for payment source | Attending: Internal Medicine | Admitting: Internal Medicine

## 2023-04-25 VITALS — BP 124/88 | HR 56 | Temp 97.9°F | Resp 16 | Ht 71.0 in | Wt 244.0 lb

## 2023-04-25 DIAGNOSIS — Z01818 Encounter for other preprocedural examination: Secondary | ICD-10-CM | POA: Insufficient documentation

## 2023-04-25 DIAGNOSIS — Z86718 Personal history of other venous thrombosis and embolism: Secondary | ICD-10-CM | POA: Diagnosis present

## 2023-04-25 NOTE — Progress Notes (Signed)
Had a DVT months ago. No currently on any anticoagulants. Needs clearance from hematology standpoint to have surgery done on 8/20 for liposuction and Gynecomastia surgery.

## 2023-04-25 NOTE — Telephone Encounter (Signed)
Printed off Dr. Alan Ripper progress note and faxed over the medical Specialist Clearance.

## 2023-04-25 NOTE — Progress Notes (Signed)
[facility]  [phone] [phone]) [phone]  ID: Jeffrey Myers OB: 11-Nov-1980  MR#: [id number]  UVO#:[id number]  Patient Care Team: Darrow Bussing, MD as PCP - General (Family Medicine) Michaelyn Barter, MD as Consulting Physician (Oncology)  REFERRING PROVIDER: Dr. Wandalee Ferdinand  REASON FOR REFERRAL: Preop clearance  HPI: Jeffrey Myers is a 42 y.o. male with past medical history of left provoked lower extremity DVT in 2021 was referred to hematology for preoperative clearance prior to liposuction procedure.  Patient reports history of abdominoplasty in 2021 in [place].  1 week after the procedure he had a long flight back to [place].  After he got back home, he noticed pain and swelling in his left leg. Venous Doppler from August 2021 showed DVT in left posterior tibial vein.  He was treated with 3 months of Eliquis.  Has not had further recurrence of blood clot.  He is planned for liposuction of abdomen, flank, back, chest and gynecomastia reduction with Dr. Delorise Shiner in [place] on August 20.  Due to his prior history of blood clot, he was advised to obtain preop clearance.  Patient is otherwise healthy with no significant comorbidities.  He is active.  REVIEW OF SYSTEMS:   ROS  As per HPI. Otherwise, a complete review of systems is negative.  PAST MEDICAL HISTORY: Past Medical History:  Diagnosis Date   Arthritis    Broken toe    At age 25   DVT (deep venous thrombosis) (HCC)    History of broken finger     PAST SURGICAL HISTORY: Past Surgical History:  Procedure Laterality Date   ABDOMINAL SURGERY     COSMETIC SURGERY  2020   Tummy tuck   SPINE SURGERY  07/2018    FAMILY HISTORY: History reviewed. No pertinent family history.  HEALTH MAINTENANCE: Social History   Tobacco Use   Smoking status: Never   Smokeless tobacco: Never  Vaping Use   Vaping status: Never Used  Substance Use Topics   Alcohol use: Not  Currently   Drug use: Never     No Known Allergies  Current Outpatient Medications  Medication Sig Dispense Refill   APIXABAN (ELIQUIS) VTE STARTER PACK (10MG  AND 5MG ) Take as directed on package: start with two-5mg  tablets twice daily for 7 days. On day 8, switch to one-5mg  tablet twice daily. (Patient not taking: Reported on 04/25/2023) 1 each 0   HYDROcodone-acetaminophen (NORCO/VICODIN) 5-325 MG tablet Take 1 tablet by mouth every 8 (eight) hours as needed. (Patient not taking: Reported on 04/25/2023) 15 tablet 0   ondansetron (ZOFRAN) 4 MG tablet Take 1 tablet (4 mg total) by mouth every 8 (eight) hours as needed for nausea or vomiting. (Patient not taking: Reported on 04/25/2023) 30 tablet 0   predniSONE (DELTASONE) 5 MG tablet Take 6 pills for first day, 5 pills second day, 4 pills third day, 3 pills fourth day, 2 pills the fifth day, and 1 pill sixth day. (Patient not taking: Reported on 04/25/2023) 21 tablet 0   tiZANidine (ZANAFLEX) 4 MG tablet Take 4 mg by mouth every 6 (six) hours as needed. (Patient not taking: Reported on 04/25/2023)     No current facility-administered medications for this visit.    OBJECTIVE: Vitals:   04/25/23 1056  BP: 124/88  Pulse: (!) 56  Resp: 16  Temp: 97.9 F (36.6 C)  SpO2: 99%     Body mass index is 34.03 kg/m.      General: Well-developed, well-nourished,  no acute distress. Eyes: Pink conjunctiva, anicteric sclera. HEENT: Normocephalic, moist mucous membranes, clear oropharnyx. Lungs: Clear to auscultation bilaterally. Heart: Regular rate and rhythm. No rubs, murmurs, or gallops. Abdomen: Soft, nontender, nondistended. No organomegaly noted, normoactive bowel sounds. Musculoskeletal: No edema, cyanosis, or clubbing. Neuro: Alert, answering all questions appropriately. Cranial nerves grossly intact. Skin: No rashes or petechiae noted. Psych: Normal affect. Lymphatics: No cervical, calvicular, axillary or inguinal LAD.   LAB RESULTS:  Lab  Results  Component Value Date   NA 142 07/23/2019   K 3.6 07/23/2019   CL 110 07/23/2019   CO2 22 07/23/2019   GLUCOSE 118 (H) 07/23/2019   BUN 11 07/23/2019   CREATININE 0.93 07/23/2019   CALCIUM 7.7 (L) 07/23/2019   PROT 6.2 (L) 07/23/2019   ALBUMIN 3.6 07/23/2019   AST 29 07/23/2019   ALT 34 07/23/2019   ALKPHOS 37 (L) 07/23/2019   BILITOT 0.7 07/23/2019   GFRNONAA >60 07/23/2019   GFRAA >60 07/23/2019    Lab Results  Component Value Date   WBC 3.9 (L) 07/23/2019   NEUTROABS 2.8 07/23/2019   HGB 14.3 07/23/2019   HCT 44.7 07/23/2019   MCV 88.7 07/23/2019   PLT 138 (L) 07/23/2019    Lab Results  Component Value Date   FERRITIN 97 07/23/2019     STUDIES: DG Chest 2 View  Result Date: 04/13/2023 CLINICAL DATA:  Preop for liposuction. EXAM: CHEST - 2 VIEW COMPARISON:  07/23/2019 FINDINGS: The cardiomediastinal contours are normal. The lungs are clear. Pulmonary vasculature is normal. No consolidation, pleural effusion, or pneumothorax. No acute osseous abnormalities are seen. IMPRESSION: Negative radiographs of the chest. Electronically Signed   By: Narda Rutherford M.D.   On: 04/13/2023 17:00    ASSESSMENT AND PLAN:   Jeffrey Myers is a 42 y.o. male with pmh of left provoked lower extremity DVT in 2021 was referred to hematology for preoperative clearance prior to liposuction procedure.  # Preoperative clearance for liposuction/gynecomastia surgery # History of provoked left posterior tibial vein DVT in 2021 - Patient reports history of abdominoplasty in 2021 in [place].  1 week after the procedure he had a flight back to [place].  After he got back home, he noticed pain and swelling in his left leg. Venous Doppler from August 2021 showed DVT in left posterior tibial vein.  He was treated with 3 months of Eliquis.  Has not had further recurrence of blood clot.  - He is planned for liposuction of abdomen, flank, back, chest and gynecomastia reduction with  Dr. Delorise Shiner in [place] on August 20.  Due to his prior history of blood clot, he was advised to obtain preop clearance. Patient is otherwise healthy with no significant comorbidities.  He is active.  Considering, patient has a history of DVT there is a higher risk of recurrence than general population with contributing factors from obesity.  I would advise use of pneumatic compression devices while patient is in the hospital.  Encourage early ambulation.  Also prophylactic dose of Lovenox 40 mg subcu once daily for 7 to 10 days.  Prophylaxis can be initiated usually 24 hours after the procedure but may have to be delayed if any unforeseen bleeding complications happen.  This will be deferred to surgeons judgment.  Patient would prefer to have the prescription picked up from [place]. Will defer to the surgeon to send a prescription of Lovenox to pharmacy in [place] after coordinating with the patient. I will fax my progress  note and clearance form to Dr. Lenox Ponds office   RTC as needed  Patient expressed understanding and was in agreement with this plan. He also understands that He can call clinic at any time with any questions, concerns, or complaints.   I spent a total of 45 minutes reviewing chart data, face-to-face evaluation with the patient, counseling and coordination of care as detailed above.  Michaelyn Barter, MD   04/25/2023 11:25 AM

## 2023-04-28 ENCOUNTER — Encounter: Payer: Self-pay | Admitting: Internal Medicine
